# Patient Record
Sex: Male | Born: 1955 | Race: White | Hispanic: No | Marital: Married | Smoking: Former smoker
Health system: Southern US, Community
[De-identification: ages and names within clinical notes are randomized; demographics above are authoritative.]

## PROBLEM LIST (undated history)

## (undated) DIAGNOSIS — I10 Essential (primary) hypertension: Secondary | ICD-10-CM

## (undated) DIAGNOSIS — N319 Neuromuscular dysfunction of bladder, unspecified: Secondary | ICD-10-CM

## (undated) DIAGNOSIS — G459 Transient cerebral ischemic attack, unspecified: Secondary | ICD-10-CM

## (undated) DIAGNOSIS — N189 Chronic kidney disease, unspecified: Secondary | ICD-10-CM

## (undated) DIAGNOSIS — E785 Hyperlipidemia, unspecified: Secondary | ICD-10-CM

## (undated) DIAGNOSIS — R42 Dizziness and giddiness: Secondary | ICD-10-CM

## (undated) DIAGNOSIS — J449 Chronic obstructive pulmonary disease, unspecified: Secondary | ICD-10-CM

## (undated) DIAGNOSIS — K449 Diaphragmatic hernia without obstruction or gangrene: Secondary | ICD-10-CM

## (undated) DIAGNOSIS — E119 Type 2 diabetes mellitus without complications: Secondary | ICD-10-CM

## (undated) HISTORY — DX: Essential (primary) hypertension: I10

## (undated) HISTORY — PX: PENILE PROSTHESIS IMPLANT: SHX240

## (undated) HISTORY — DX: Chronic kidney disease, unspecified: N18.9

## (undated) HISTORY — DX: Type 2 diabetes mellitus without complications: E11.9

## (undated) HISTORY — DX: Chronic obstructive pulmonary disease, unspecified: J44.9

## (undated) HISTORY — DX: Transient cerebral ischemic attack, unspecified: G45.9

## (undated) HISTORY — DX: Diaphragmatic hernia without obstruction or gangrene: K44.9

## (undated) HISTORY — PX: AMPUTATION TOE: SHX6595

## (undated) HISTORY — DX: Hyperlipidemia, unspecified: E78.5

## (undated) HISTORY — DX: Neuromuscular dysfunction of bladder, unspecified: N31.9

## (undated) HISTORY — DX: Dizziness and giddiness: R42

---

## 2013-11-19 DIAGNOSIS — I2510 Atherosclerotic heart disease of native coronary artery without angina pectoris: Secondary | ICD-10-CM | POA: Insufficient documentation

## 2014-05-03 DIAGNOSIS — J449 Chronic obstructive pulmonary disease, unspecified: Secondary | ICD-10-CM | POA: Insufficient documentation

## 2015-05-04 HISTORY — PX: CATARACT EXTRACTION: SUR2

## 2018-12-20 DIAGNOSIS — G4730 Sleep apnea, unspecified: Secondary | ICD-10-CM | POA: Insufficient documentation

## 2019-11-21 ENCOUNTER — Encounter: Payer: Self-pay | Admitting: Sports Medicine

## 2019-11-21 ENCOUNTER — Ambulatory Visit: Payer: Self-pay

## 2019-11-21 ENCOUNTER — Other Ambulatory Visit: Payer: Self-pay

## 2019-11-21 ENCOUNTER — Ambulatory Visit (INDEPENDENT_AMBULATORY_CARE_PROVIDER_SITE_OTHER): Payer: Commercial Managed Care - PPO | Admitting: Sports Medicine

## 2019-11-21 DIAGNOSIS — I739 Peripheral vascular disease, unspecified: Secondary | ICD-10-CM | POA: Diagnosis not present

## 2019-11-21 DIAGNOSIS — E1142 Type 2 diabetes mellitus with diabetic polyneuropathy: Secondary | ICD-10-CM

## 2019-11-21 DIAGNOSIS — M86171 Other acute osteomyelitis, right ankle and foot: Secondary | ICD-10-CM | POA: Diagnosis not present

## 2019-11-21 DIAGNOSIS — L97511 Non-pressure chronic ulcer of other part of right foot limited to breakdown of skin: Secondary | ICD-10-CM | POA: Diagnosis not present

## 2019-11-21 DIAGNOSIS — M79671 Pain in right foot: Secondary | ICD-10-CM

## 2019-11-21 DIAGNOSIS — M216X2 Other acquired deformities of left foot: Secondary | ICD-10-CM | POA: Diagnosis not present

## 2019-11-21 NOTE — Progress Notes (Signed)
Subjective: Nowell Sites is a 64 y.o. male patient seen in office for evaluation of ulceration of the right foot.  Patient reports that this ulcer has been a problem for the last year and a half with it started with the bone popping through the skin and then the opening think and never healed with significant drainage swelling redness wife has been consistent with applying gentamicin ointment and a silver patch to the area and also reports that he is currently on PICC line antibiotics of cefepime and doxycycline which is being managed by infectious disease, reports that he is supposed to go for a MRI on tomorrow and was referred here for further evaluation of the wound for possible consideration for skin graft.  Patient denies current symptoms of nausea vomiting fever or chills  Fasting blood sugar not recorded A1c not noted  Currently on Plavix and Januvia and Lantus  Review of symptoms noncontributory  There are no problems to display for this patient.  Current Outpatient Medications on File Prior to Visit  Medication Sig Dispense Refill   cefdinir (OMNICEF) 300 MG capsule Take 300 mg by mouth 2 (two) times daily.     ciprofloxacin (CIPRO) 500 MG tablet      clopidogrel (PLAVIX) 75 MG tablet Take 75 mg by mouth daily.     doxycycline (VIBRAMYCIN) 100 MG capsule Take 100 mg by mouth 2 (two) times daily.     gentamicin ointment (GARAMYCIN) 0.1 % SMARTSIG:1 Topical Every Night     LANTUS SOLOSTAR 100 UNIT/ML Solostar Pen Inject into the skin.     montelukast (SINGULAIR) 10 MG tablet Take 10 mg by mouth daily.     No current facility-administered medications on file prior to visit.   Not on File  No results found for this or any previous visit (from the past 2160 hour(s)).  Objective: There were no vitals filed for this visit.  General: Patient is awake, alert, oriented x 3 and in no acute distress.  Dermatology: Skin is warm and dry bilateral with a full thickness ulceration  present  Right fifth metatarsal head. Ulceration measures 2 cm x 2.5 cm x 0.3 cm. There is a  Minimal keratotic border with a fibrogranular base. The ulceration does not  probe to bone but is close to capsule. There is no malodor, no active drainage, blanchable erythema, focal edema. No acute signs of infection.   Vascular: Dorsalis Pedis pulse = 1/4 Bilateral,  Posterior Tibial pulse = 1/4 Bilateral,  Capillary Fill Time < 5 seconds  Neurologic: Protective sensation absent bilateral foot there is increased hypersensitivity to ulcerated area.  Musculosketal: There is digital contracture especially on the left with history of previous surgery at this area.  There is mild sensitivity to ulcerated area on the right.  Xrays, right foot: There is bony destruction suggestive of osteomyelitis at the head of the metatarsal with overlying ulcer defect. No gas in soft tissues.  Hammertoe deformity.  No other acute findings.  No results for input(s): GRAMSTAIN, LABORGA in the last 8760 hours.  Assessment and Plan:  Problem List Items Addressed This Visit    None    Visit Diagnoses    Ulcer of right foot limited to breakdown of skin (HCC)    -  Primary   Relevant Orders   DG Foot Complete Right   PVD (peripheral vascular disease) (HCC)       Acute osteomyelitis of right ankle or foot (HCC)       Relevant Medications  cefdinir (OMNICEF) 300 MG capsule   gentamicin ointment (GARAMYCIN) 0.1 %   Prominent metatarsal head of left foot       Diabetic polyneuropathy associated with type 2 diabetes mellitus (HCC)       Relevant Medications   LANTUS SOLOSTAR 100 UNIT/ML Solostar Pen   Right foot pain           -Examined patient and discussed the progression of the wound and treatment alternatives. -Xrays reviewed - Excisionally dedbrided ulceration at right lateral foot at the fifth metatarsophalangeal joint to healthy bleeding borders removing nonviable tissue using a sterile chisel blade. Wound  measures post debridement as above. Wound was debrided to the level of the dermis with viable wound base exposed to promote healing. Hemostasis was achieved with manuel pressure. Patient tolerated procedure well without any discomfort or anesthesia necessary for this wound debridement.  -Applied antibiotic cream, offloading pad, and dry sterile dressing and instructed patient to continue with daily dressings at home consisting of the same using offloading padding and gentamicin ointment as well as silver patch that he has at home already -Wound culture reviewed from notes sent by his referring provider history of Pseudomonas continue with antibiotics as recommended by infectious disease of cefepime and doxycycline -Patient to go for MRI on tomorrow for further evaluation and advised patient once the wrist results are available we will give him a phone call for further discussion of treatment options likely patient may need fifth metatarsal head resection with wound debridement and grafting in order to ensure healing however we will make this determination on what patient surgically needs after he gets his MRI performed - Advised patient to go to the ER or return to office if the wound worsens or if constitutional symptoms are present. -Patient to return to office in after MRI for follow up care and evaluation or sooner if problems arise.  Asencion Islam, DPM

## 2019-12-04 ENCOUNTER — Ambulatory Visit (INDEPENDENT_AMBULATORY_CARE_PROVIDER_SITE_OTHER): Payer: Commercial Managed Care - PPO | Admitting: Sports Medicine

## 2019-12-04 ENCOUNTER — Encounter: Payer: Self-pay | Admitting: Sports Medicine

## 2019-12-04 ENCOUNTER — Other Ambulatory Visit: Payer: Self-pay

## 2019-12-04 DIAGNOSIS — I739 Peripheral vascular disease, unspecified: Secondary | ICD-10-CM

## 2019-12-04 DIAGNOSIS — M79671 Pain in right foot: Secondary | ICD-10-CM

## 2019-12-04 DIAGNOSIS — L97514 Non-pressure chronic ulcer of other part of right foot with necrosis of bone: Secondary | ICD-10-CM | POA: Diagnosis not present

## 2019-12-04 DIAGNOSIS — M86171 Other acute osteomyelitis, right ankle and foot: Secondary | ICD-10-CM

## 2019-12-04 DIAGNOSIS — E1142 Type 2 diabetes mellitus with diabetic polyneuropathy: Secondary | ICD-10-CM

## 2019-12-04 DIAGNOSIS — M216X2 Other acquired deformities of left foot: Secondary | ICD-10-CM

## 2019-12-04 NOTE — Progress Notes (Signed)
Subjective: Chase Villanueva is a 64 y.o. male patient seen in office for evaluation of ulceration of the right foot.  Patient reports that this ulcer has been drying up and not draining anything.  Have the lab helping with change the dressing daily applying gentamicin cream and silver patch to the area with padding cushioning to the right fifth toe.  Patient reports that he had his MRI done last week is also here to discuss the results.  Patient denies nausea vomiting fever chills or any other acute symptoms at this time.  Currently on PICC line antibiotics of cefepime and doxycycline managed by infectious disease at [facility].  Fasting blood sugar not recorded A1c not noted  Currently on Plavix and Januvia and Lantus  There are no problems to display for this patient.  Current Outpatient Medications on File Prior to Visit  Medication Sig Dispense Refill  . cefdinir (OMNICEF) 300 MG capsule Take 300 mg by mouth 2 (two) times daily.    . ciprofloxacin (CIPRO) 500 MG tablet     . clopidogrel (PLAVIX) 75 MG tablet Take 75 mg by mouth daily.    Marland Kitchen doxycycline (VIBRAMYCIN) 100 MG capsule Take 100 mg by mouth 2 (two) times daily.    Marland Kitchen gentamicin ointment (GARAMYCIN) 0.1 % SMARTSIG:1 Topical Every Night    . LANTUS SOLOSTAR 100 UNIT/ML Solostar Pen Inject into the skin.    . montelukast (SINGULAIR) 10 MG tablet Take 10 mg by mouth daily.     No current facility-administered medications on file prior to visit.   Not on File  No results found for this or any previous visit (from the past 2160 hour(s)).  Objective: There were no vitals filed for this visit.  General: Patient is awake, alert, oriented x 3 and in no acute distress.  Dermatology: Skin is warm and dry bilateral with a full thickness ulceration present  Right fifth metatarsal head. Ulceration measures 2 cm x 2.5 cm x 0.3 cm unchanged from last visit. There is a  Minimal keratotic border with a fibrogranular base,  probes to bone. There is no malodor, no active drainage, blanchable erythema, minimal focal edema. No acute signs of infection.   Vascular: Dorsalis Pedis pulse = 1/4 Bilateral,  Posterior Tibial pulse = 1/4 Bilateral,  Capillary Fill Time < 5 seconds  Neurologic: Protective sensation absent bilateral foot there is increased hypersensitivity to ulcerated area.  Musculosketal: There is digital contracture especially on the left with history of previous surgery at this area.  There is mild sensitivity to ulcerated area on the right and mild varus rotation of the right fifth toe.  MRI supportive of osteomyelitis at the head and surgical neck of the fifth metatarsal right foot  No results for input(s): GRAMSTAIN, LABORGA in the last 8760 hours.  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 - Excisionally  dedbrided ulceration at right lateral foot at the fifth metatarsophalangeal joint to healthy bleeding borders removing nonviable tissue using a sterile chisel blade. Wound measures post debridement as above. Wound was debrided to the level of the dermis with viable wound base exposed to promote healing. Hemostasis was achieved with manuel pressure. Patient tolerated procedure well without any discomfort or anesthesia necessary for this wound debridement.  -Applied antibiotic cream, offloading pad, and dry sterile dressing and instructed patient to continue with daily dressings at home consisting of the same using offloading padding and gentamicin  ointment as well as silver patch that he has at home already like before -Continue with PICC line antibiotics is managed by infectious disease of cefepime and doxycycline; advised patient to have his infectious disease doctor to fax any updates regarding his antibiotic therapy at this time will be planning surgery since an MRI suggest that there is osteomyelitis at the fifth metatarsal -Patient opt for surgical management. Consent obtained for wound debridement, application of graft, and removal of infected bone, fifth metatarsal head, right foot.  Pre and Post op course explained. Risks, benefits, alternatives explained. No guarantees given or implied. Surgical booking slip submitted and provided patient with Surgical packet and info for [facility] -Patient to get H&P from Dr. Len Childs office -Order placed for [facility] for presurgical PT as well as any postoperative care as well -Patient to return to office in 2 weeks for wound care or surgery which ever is sooner.  Asencion Islam, DPM

## 2019-12-10 ENCOUNTER — Telehealth: Payer: Self-pay | Admitting: *Deleted

## 2019-12-10 DIAGNOSIS — Z01818 Encounter for other preprocedural examination: Secondary | ICD-10-CM

## 2019-12-10 NOTE — Telephone Encounter (Signed)
"  I'm calling on behalf of Linard Daft .  I'm calling to follow-up and see what times and dates that the hospital is available.  His number is [phone].

## 2019-12-11 NOTE — Telephone Encounter (Signed)
I am returning your call.  I apologize for the wait in calling you back.  I'm filling in for Shelly while she's out of the office.  Do you have a surgery date that you like?  "I'd like to do it on Monday, the 16th or on this Friday, the 13th.  I was told she does surgeries on Mondays."  I'll see if they have anything available but I can't guarantee it on Mondays because they usually don't have anything available.  "Will you call and let me know as soon as you know something because my wife has to make arrangements?"  I will let you know as soon as I hear from the scheduler at [facility].  I spoke to Ms. Alona Bene at [facility].  She asked me to fax the information and they would contact me with the time that's available.

## 2019-12-12 NOTE — Telephone Encounter (Signed)
DOS 12/17/2019  CPT CODES: 79892 - METATARSAL HEAD RESECTION AND 11941 - WOUND DEBRIDEMENT RIGHT FOOT  UMR: Eligibility Date - 10/01/2017  Co-pay: $0 Co-insurance: 90% / 10% Deductible:  $3000 met Out of pocket:  $5000 / $3931.44 met applied  AUTHORIZATION IS REQUIRED     I called and spoke to Karlene Lineman C to initiate authorization for Mr. Chase Villanueva's surgery.  He informed me that clinicals were needed to process the request.  The pending case number is [id number].  He asked me to fax the notes to [phone].    Clinical notes were faxed as requested.

## 2019-12-12 NOTE — Telephone Encounter (Signed)
I am calling to let you know you're scheduled for surgery on Monday, 12/17/2019.  "I got a call from the hospital and they told me."  You need to have a Covid test done prior to the surgery date.  They normally ask you to do it 3 days prior to your surgery date.  Can you go by the [place] office to pick up your lab requisition and go to [facility] on tomorrow?  "I sure can.  Where's that located?"  Ask them at our office and someone can give you the address.  "I'll take care of it tomorrow."

## 2019-12-13 ENCOUNTER — Other Ambulatory Visit: Payer: Self-pay

## 2019-12-13 DIAGNOSIS — Z20822 Contact with and (suspected) exposure to covid-19: Secondary | ICD-10-CM

## 2019-12-14 LAB — SAR COV2 SEROLOGY (COVID19)AB(IGG),IA: DiaSorin SARS-CoV-2 Ab, IgG: NEGATIVE

## 2019-12-16 ENCOUNTER — Other Ambulatory Visit: Payer: Self-pay | Admitting: Sports Medicine

## 2019-12-16 NOTE — Progress Notes (Signed)
Post op meds entered 

## 2019-12-17 DIAGNOSIS — M21541 Acquired clubfoot, right foot: Secondary | ICD-10-CM | POA: Diagnosis not present

## 2019-12-17 DIAGNOSIS — E11621 Type 2 diabetes mellitus with foot ulcer: Secondary | ICD-10-CM

## 2019-12-17 MED ORDER — HYDROCODONE-ACETAMINOPHEN 5-325 MG PO TABS: 1.0000 | ORAL_TABLET | Freq: Four times a day (QID) | ORAL | 0 refills | Status: AC | PRN

## 2019-12-17 MED ORDER — PROMETHAZINE HCL 12.5 MG PO TABS
12.5000 mg | ORAL_TABLET | Freq: Three times a day (TID) | ORAL | 0 refills | Status: DC | PRN
Start: 2019-12-17 — End: 2020-02-27

## 2019-12-17 MED ORDER — DOCUSATE SODIUM 100 MG PO CAPS
100.0000 mg | ORAL_CAPSULE | Freq: Two times a day (BID) | ORAL | 0 refills | Status: DC
Start: 2019-12-17 — End: 2020-02-27

## 2019-12-18 ENCOUNTER — Ambulatory Visit: Payer: Commercial Managed Care - PPO | Admitting: Sports Medicine

## 2019-12-18 ENCOUNTER — Telehealth: Payer: Self-pay | Admitting: Sports Medicine

## 2019-12-18 NOTE — Telephone Encounter (Signed)
Post op check phone call made to patient.  Patient reports that he was up and down on last night but otherwise is doing okay states that his pain this morning is back to normal only numbness and tingling like he had before surgery related to his neuropathy.  Advised patient to continue with rest, elevation, and taking his medications as prescribed.  Advised patient to call office if there are any other problems or concerns.  Patient thanked me for my call and advised me to be careful out there. -Dr. Marylene Land

## 2019-12-25 ENCOUNTER — Ambulatory Visit (INDEPENDENT_AMBULATORY_CARE_PROVIDER_SITE_OTHER): Payer: Commercial Managed Care - PPO | Admitting: Sports Medicine

## 2019-12-25 ENCOUNTER — Other Ambulatory Visit: Payer: Self-pay | Admitting: Sports Medicine

## 2019-12-25 ENCOUNTER — Ambulatory Visit: Payer: Self-pay

## 2019-12-25 ENCOUNTER — Encounter: Payer: Self-pay | Admitting: Sports Medicine

## 2019-12-25 ENCOUNTER — Other Ambulatory Visit: Payer: Self-pay

## 2019-12-25 DIAGNOSIS — M79671 Pain in right foot: Secondary | ICD-10-CM

## 2019-12-25 DIAGNOSIS — E1142 Type 2 diabetes mellitus with diabetic polyneuropathy: Secondary | ICD-10-CM

## 2019-12-25 DIAGNOSIS — M86171 Other acute osteomyelitis, right ankle and foot: Secondary | ICD-10-CM

## 2019-12-25 DIAGNOSIS — Z9889 Other specified postprocedural states: Secondary | ICD-10-CM

## 2019-12-25 NOTE — Progress Notes (Signed)
Subjective: Chase Villanueva is a 64 y.o. male patient seen today in office for POV #1 (DOS 12-17-19), S/P right 5th met head resection and application of graft sub met 5 on right, patient denies pain at surgical site, denies constiutional symptoms except a little sharp pain to toes 2-3 bilateral. No other issues noted.   There are no problems to display for this patient.   Current Outpatient Medications on File Prior to Visit  Medication Sig Dispense Refill  . cefdinir (OMNICEF) 300 MG capsule Take 300 mg by mouth 2 (two) times daily.    . ciprofloxacin (CIPRO) 500 MG tablet     . clopidogrel (PLAVIX) 75 MG tablet Take 75 mg by mouth daily.    Marland Kitchen docusate sodium (COLACE) 100 MG capsule Take 1 capsule (100 mg total) by mouth 2 (two) times daily. 10 capsule 0  . doxycycline (VIBRAMYCIN) 100 MG capsule Take 100 mg by mouth 2 (two) times daily.    Marland Kitchen gentamicin ointment (GARAMYCIN) 0.1 % SMARTSIG:1 Topical Every Night    . LANTUS SOLOSTAR 100 UNIT/ML Solostar Pen Inject into the skin.    . montelukast (SINGULAIR) 10 MG tablet Take 10 mg by mouth daily.    . promethazine (PHENERGAN) 12.5 MG tablet Take 1 tablet (12.5 mg total) by mouth every 8 (eight) hours as needed for nausea or vomiting. 20 tablet 0   No current facility-administered medications on file prior to visit.    Not on File  Objective: There were no vitals filed for this visit.  General: No acute distress, AAOx3  Right foot: Sutures  and staples intact with no gapping or dehiscence at surgical site, graft plantarly appears to be incorporating, mild swelling to right, no erythema, no warmth, no drainage, no signs of infection noted, Capillary fill time <3 seconds in all remaining digits, gross sensation present via light touch to right foot.  No pain with calf compression.   Post Op Xray, Right foot, s/p 5th met resection, Soft tissue swelling within normal limits for post op status.   Assessment and Plan:  Problem List Items  Addressed This Visit    None    Visit Diagnoses    S/P foot surgery, right    -  Primary   Acute osteomyelitis of right ankle or foot ([place])       Diabetic polyneuropathy associated with type 2 diabetes mellitus ([place])       Right foot pain           -Patient seen and evaluated -Xrays reviewed  -Applied adaptic and dry sterile dressing to surgical site right foot secured with ACE wrap and stockinet  -Advised patient to make sure to keep dressings clean, dry, and intact to right surgical site -Continue with CAM boot/surgical shoe and limit weightbearing and activity  -Continue with PICC line antibiotics to end on 8/27, discussed path and micro findings with ID -Will plan for possible suture removal at next office visit. In the meantime, patient to call office if any issues or problems arise.   Landis Martins, DPM

## 2020-01-01 ENCOUNTER — Ambulatory Visit (INDEPENDENT_AMBULATORY_CARE_PROVIDER_SITE_OTHER): Payer: Commercial Managed Care - PPO | Admitting: Sports Medicine

## 2020-01-01 ENCOUNTER — Other Ambulatory Visit: Payer: Self-pay

## 2020-01-01 DIAGNOSIS — M79671 Pain in right foot: Secondary | ICD-10-CM

## 2020-01-01 DIAGNOSIS — Z9889 Other specified postprocedural states: Secondary | ICD-10-CM

## 2020-01-01 DIAGNOSIS — E1142 Type 2 diabetes mellitus with diabetic polyneuropathy: Secondary | ICD-10-CM

## 2020-01-01 DIAGNOSIS — M86171 Other acute osteomyelitis, right ankle and foot: Secondary | ICD-10-CM

## 2020-01-01 NOTE — Progress Notes (Signed)
Subjective: Chase Villanueva is a 64 y.o. male patient seen today in office for POV # 2 (DOS 12-17-19), S/P right 5th met head resection and application of graft sub met 5 on right, patient denies pain at surgical site, denies constiutional symptoms except a little pain every once in a while but otherwise doing good.  Patient also request for me to trim his toenails. No other issues noted.   There are no problems to display for this patient.   Current Outpatient Medications on File Prior to Visit  Medication Sig Dispense Refill  . cefdinir (OMNICEF) 300 MG capsule Take 300 mg by mouth 2 (two) times daily.    . ciprofloxacin (CIPRO) 500 MG tablet     . clopidogrel (PLAVIX) 75 MG tablet Take 75 mg by mouth daily.    Marland Kitchen docusate sodium (COLACE) 100 MG capsule Take 1 capsule (100 mg total) by mouth 2 (two) times daily. 10 capsule 0  . doxycycline (VIBRAMYCIN) 100 MG capsule Take 100 mg by mouth 2 (two) times daily.    Marland Kitchen gentamicin ointment (GARAMYCIN) 0.1 % SMARTSIG:1 Topical Every Night    . LANTUS SOLOSTAR 100 UNIT/ML Solostar Pen Inject into the skin.    . montelukast (SINGULAIR) 10 MG tablet Take 10 mg by mouth daily.    . promethazine (PHENERGAN) 12.5 MG tablet Take 1 tablet (12.5 mg total) by mouth every 8 (eight) hours as needed for nausea or vomiting. 20 tablet 0   No current facility-administered medications on file prior to visit.    Not on File  Objective: There were no vitals filed for this visit.  General: No acute distress, AAOx3  Right foot: Sutures  and staples intact with no gapping or dehiscence at surgical site, graft plantarly appears to be incorporating with mild dried blood to the area, mild swelling to right, no erythema, no warmth, no drainage, no signs of infection noted, Capillary fill time <3 seconds in all remaining digits, gross sensation present via light touch to right foot.  No pain with calf compression.    Assessment and Plan:  Problem List Items Addressed  This Visit    None    Visit Diagnoses    S/P foot surgery, right    -  Primary   Acute osteomyelitis of right ankle or foot ([place])       Diabetic polyneuropathy associated with type 2 diabetes mellitus ([place])       Right foot pain           -Patient seen and evaluated -Sutures were removed but staples left in place -Applied adaptic and dry sterile dressing to surgical site right foot secured with ACE wrap and stockinet  -Advised patient to make sure to keep dressings clean, dry, and intact to right surgical site -Continue with CAM boot/surgical shoe and limit weightbearing and activity  -PICC line antibiotics completed -At no additional charge mechanically debrided nails using a sterile tissue nipper without incident -Will plan for possible staple removal at next office visit. In the meantime, patient to call office if any issues or problems arise.   Landis Martins, DPM

## 2020-01-08 ENCOUNTER — Encounter: Payer: Self-pay | Admitting: Sports Medicine

## 2020-01-08 ENCOUNTER — Ambulatory Visit (INDEPENDENT_AMBULATORY_CARE_PROVIDER_SITE_OTHER): Payer: Commercial Managed Care - PPO | Admitting: Sports Medicine

## 2020-01-08 ENCOUNTER — Other Ambulatory Visit: Payer: Self-pay

## 2020-01-08 DIAGNOSIS — M86171 Other acute osteomyelitis, right ankle and foot: Secondary | ICD-10-CM

## 2020-01-08 DIAGNOSIS — Z9889 Other specified postprocedural states: Secondary | ICD-10-CM

## 2020-01-08 DIAGNOSIS — E1142 Type 2 diabetes mellitus with diabetic polyneuropathy: Secondary | ICD-10-CM

## 2020-01-08 DIAGNOSIS — M79671 Pain in right foot: Secondary | ICD-10-CM

## 2020-01-08 NOTE — Progress Notes (Signed)
Subjective: Parker Wherley is a 64 y.o. male patient seen today in office for POV #3 (DOS 12-17-19), S/P right 5th met head resection and application of graft sub met 5 on right, patient denies pain at surgical site, and reports that he was able to keep dressing clean dry and intact and is ready to go fishing. Denies nausea vomiting fever chills or any other constitutional symptoms at this time. No other issues noted.   Fasting blood sugar 120 A1c 6.5  There are no problems to display for this patient.   Current Outpatient Medications on File Prior to Visit  Medication Sig Dispense Refill  . cefdinir (OMNICEF) 300 MG capsule Take 300 mg by mouth 2 (two) times daily.    . ciprofloxacin (CIPRO) 500 MG tablet     . clopidogrel (PLAVIX) 75 MG tablet Take 75 mg by mouth daily.    Marland Kitchen docusate sodium (COLACE) 100 MG capsule Take 1 capsule (100 mg total) by mouth 2 (two) times daily. 10 capsule 0  . doxycycline (VIBRAMYCIN) 100 MG capsule Take 100 mg by mouth 2 (two) times daily.    Marland Kitchen gentamicin ointment (GARAMYCIN) 0.1 % SMARTSIG:1 Topical Every Night    . LANTUS SOLOSTAR 100 UNIT/ML Solostar Pen Inject into the skin.    . montelukast (SINGULAIR) 10 MG tablet Take 10 mg by mouth daily.    . promethazine (PHENERGAN) 12.5 MG tablet Take 1 tablet (12.5 mg total) by mouth every 8 (eight) hours as needed for nausea or vomiting. 20 tablet 0   No current facility-administered medications on file prior to visit.    Not on File  Objective: There were no vitals filed for this visit.  General: No acute distress, AAOx3  Right foot: Sutures  and staples intact with no gapping or dehiscence at surgical site, graft plantarly appears to be incorporating with mild dried blood to the area once this dried blood was debrided there was of note ulcer bed with remnants of graft that measure less than 1 cm at the lateral fifth metatarsal head, mild swelling to right, no erythema, no warmth, no drainage, no signs of  infection noted, Capillary fill time <3 seconds in all remaining digits, gross sensation present via light touch to right foot.  No pain with calf compression.    Assessment and Plan:  Problem List Items Addressed This Visit    None    Visit Diagnoses    S/P foot surgery, right    -  Primary   Acute osteomyelitis of right ankle or foot ([place])       Diabetic polyneuropathy associated with type 2 diabetes mellitus ([place])       Right foot pain           -Patient seen and evaluated -Staples were removed and graft/wound ulcer site was debrided using a 15 blade to healthy bleeding margins we applied Adaptic and dry dressing advised patient to keep clean dry and intact until next office visit -Continue with CAM boot/surgical shoe and limit weightbearing and activity  -Will plan for wound check at next office visit. In the meantime, patient to call office if any issues or problems arise.   Landis Martins, DPM

## 2020-01-15 ENCOUNTER — Encounter: Payer: Commercial Managed Care - PPO | Admitting: Sports Medicine

## 2020-01-22 ENCOUNTER — Ambulatory Visit (INDEPENDENT_AMBULATORY_CARE_PROVIDER_SITE_OTHER): Payer: Commercial Managed Care - PPO | Admitting: Sports Medicine

## 2020-01-22 ENCOUNTER — Other Ambulatory Visit: Payer: Self-pay

## 2020-01-22 ENCOUNTER — Encounter: Payer: Self-pay | Admitting: Sports Medicine

## 2020-01-22 DIAGNOSIS — M79671 Pain in right foot: Secondary | ICD-10-CM

## 2020-01-22 DIAGNOSIS — E1142 Type 2 diabetes mellitus with diabetic polyneuropathy: Secondary | ICD-10-CM

## 2020-01-22 DIAGNOSIS — M86171 Other acute osteomyelitis, right ankle and foot: Secondary | ICD-10-CM

## 2020-01-22 DIAGNOSIS — Z9889 Other specified postprocedural states: Secondary | ICD-10-CM

## 2020-01-22 NOTE — Progress Notes (Signed)
Subjective: Chase Villanueva is a 63 y.o. male patient seen today in office for POV # 4 (DOS 12-17-19), S/P right 5th met head resection and application of graft sub met 5 on right, patient denies pain at surgical site, and reports that he was able to keep dressing clean dry with wife helping to change and applied a silver patch to the area.  Patient reports that he did have a slip and fall in his surgical shoe this week but otherwise denies any other pedal complaints at this time.  Fasting blood sugar around 120 A1c 6.5 like before.  There are no problems to display for this patient.   Current Outpatient Medications on File Prior to Visit  Medication Sig Dispense Refill  . cefdinir (OMNICEF) 300 MG capsule Take 300 mg by mouth 2 (two) times daily.    . ciprofloxacin (CIPRO) 500 MG tablet     . clopidogrel (PLAVIX) 75 MG tablet Take 75 mg by mouth daily.    Marland Kitchen docusate sodium (COLACE) 100 MG capsule Take 1 capsule (100 mg total) by mouth 2 (two) times daily. 10 capsule 0  . doxycycline (VIBRAMYCIN) 100 MG capsule Take 100 mg by mouth 2 (two) times daily.    Marland Kitchen gentamicin ointment (GARAMYCIN) 0.1 % SMARTSIG:1 Topical Every Night    . LANTUS SOLOSTAR 100 UNIT/ML Solostar Pen Inject into the skin.    . montelukast (SINGULAIR) 10 MG tablet Take 10 mg by mouth daily.    . promethazine (PHENERGAN) 12.5 MG tablet Take 1 tablet (12.5 mg total) by mouth every 8 (eight) hours as needed for nausea or vomiting. 20 tablet 0   No current facility-administered medications on file prior to visit.    Not on File  Objective: There were no vitals filed for this visit.  General: No acute distress, AAOx3  Right foot: To the right lateral fifth MPJ there is a partial-thickness ulceration that measures 0.3 x 0.4 cm with granular base, mild swelling to right, no erythema, no warmth, no drainage, no signs of infection noted, Capillary fill time <3 seconds in all remaining digits, gross sensation present via light  touch to right foot.  No pain with calf compression.    Assessment and Plan:  Problem List Items Addressed This Visit    None    Visit Diagnoses    S/P foot surgery, right    -  Primary   Acute osteomyelitis of right ankle or foot ([place])       Diabetic polyneuropathy associated with type 2 diabetes mellitus ([place])       Right foot pain           -Patient seen and evaluated -Using a sterile tissue nipper debrided wound at the lateral fifth MPJ to healthy bleeding margins we applied promagran/Prisma dressing covered with dry dressing and advised patient to have wife assist with dressing changes 2 times per week -Continue with CAM boot/surgical shoe and limit weightbearing and activity; replacement surgical shoe provided at this visit to ensure that patient will not have issues with slips or falls -Will plan for wound check at next office visit. In the meantime, patient to call office if any issues or problems arise.   Landis Martins, DPM

## 2020-01-30 ENCOUNTER — Encounter: Payer: Self-pay | Admitting: Sports Medicine

## 2020-01-30 ENCOUNTER — Other Ambulatory Visit: Payer: Self-pay

## 2020-01-30 ENCOUNTER — Ambulatory Visit (INDEPENDENT_AMBULATORY_CARE_PROVIDER_SITE_OTHER): Payer: Commercial Managed Care - PPO | Admitting: Sports Medicine

## 2020-01-30 DIAGNOSIS — M79671 Pain in right foot: Secondary | ICD-10-CM

## 2020-01-30 DIAGNOSIS — E1142 Type 2 diabetes mellitus with diabetic polyneuropathy: Secondary | ICD-10-CM

## 2020-01-30 DIAGNOSIS — Z9889 Other specified postprocedural states: Secondary | ICD-10-CM

## 2020-01-30 DIAGNOSIS — M86171 Other acute osteomyelitis, right ankle and foot: Secondary | ICD-10-CM

## 2020-01-30 NOTE — Progress Notes (Signed)
Subjective: Chase Villanueva is a 64 y.o. male patient seen today in office for POV # 5 (DOS 12-17-19), S/P right 5th met head resection and application of graft sub met 5 on right, patient denies pain at surgical site, and reports that he is doing good his wife has been helping with changing the dressing using Prisma as instructed without any issues and he thinks that it is healed.   Fasting blood sugar around 120 like before without any issues A1c 6.5 like before.  There are no problems to display for this patient.   Current Outpatient Medications on File Prior to Visit  Medication Sig Dispense Refill  . cefdinir (OMNICEF) 300 MG capsule Take 300 mg by mouth 2 (two) times daily.    . ciprofloxacin (CIPRO) 500 MG tablet     . clopidogrel (PLAVIX) 75 MG tablet Take 75 mg by mouth daily.    Marland Kitchen docusate sodium (COLACE) 100 MG capsule Take 1 capsule (100 mg total) by mouth 2 (two) times daily. 10 capsule 0  . doxycycline (VIBRAMYCIN) 100 MG capsule Take 100 mg by mouth 2 (two) times daily.    Marland Kitchen gentamicin ointment (GARAMYCIN) 0.1 % SMARTSIG:1 Topical Every Night    . LANTUS SOLOSTAR 100 UNIT/ML Solostar Pen Inject into the skin.    . montelukast (SINGULAIR) 10 MG tablet Take 10 mg by mouth daily.    . promethazine (PHENERGAN) 12.5 MG tablet Take 1 tablet (12.5 mg total) by mouth every 8 (eight) hours as needed for nausea or vomiting. 20 tablet 0   No current facility-administered medications on file prior to visit.    Not on File  Objective: There were no vitals filed for this visit.  General: No acute distress, AAOx3  Right foot: To the right lateral fifth MPJ there is a prematurely healed ulceration at the lateral aspect of the fifth metatarsal phalangeal joint, mild swelling to right, no erythema, no warmth, no drainage, no signs of infection noted, Capillary fill time <3 seconds in all remaining digits, gross sensation present via light touch to right foot.  No pain with calf compression.     Assessment and Plan:  Problem List Items Addressed This Visit    None    Visit Diagnoses    S/P foot surgery, right    -  Primary   Acute osteomyelitis of right ankle or foot ([place])       Diabetic polyneuropathy associated with type 2 diabetes mellitus ([place])       Right foot pain           -Patient seen and evaluated -Right surgical site appears to be prematurely healed advised patient and wife to apply dry dressing daily on next Monday or Tuesday may attempt to shower and then redressed with dry dressing if area remains scabbed over and healed on next visit may discuss transitioning to a normal shoe and going without a dressing however at this time patient to continue to protect the area -Continue with surgical shoe and limited activity until next office visit -Advised patient to bring shoe and current orthotic with him to next visit for me to evaluate to see if he can start to wear a normal shoe -Will plan for wound check at next office visit. In the meantime, patient to call office if any issues or problems arise.   Landis Martins, DPM

## 2020-02-06 ENCOUNTER — Other Ambulatory Visit: Payer: Self-pay

## 2020-02-06 ENCOUNTER — Encounter: Payer: Self-pay | Admitting: Sports Medicine

## 2020-02-06 ENCOUNTER — Ambulatory Visit (INDEPENDENT_AMBULATORY_CARE_PROVIDER_SITE_OTHER): Payer: Commercial Managed Care - PPO | Admitting: Sports Medicine

## 2020-02-06 DIAGNOSIS — M86171 Other acute osteomyelitis, right ankle and foot: Secondary | ICD-10-CM

## 2020-02-06 DIAGNOSIS — E1142 Type 2 diabetes mellitus with diabetic polyneuropathy: Secondary | ICD-10-CM

## 2020-02-06 DIAGNOSIS — Z9889 Other specified postprocedural states: Secondary | ICD-10-CM

## 2020-02-06 DIAGNOSIS — M79671 Pain in right foot: Secondary | ICD-10-CM

## 2020-02-06 NOTE — Progress Notes (Signed)
Subjective: Chase Villanueva is a 64 y.o. male patient seen today in office for POV # 6 (DOS 12-17-19), S/P right 5th met head resection and application of graft sub met 5 on right, patient denies pain at surgical site, and reports that he had a little drainage from the Drysol 2 days ago but otherwise is doing well.  Fasting blood not recorded but A1c 6.5 like before.  There are no problems to display for this patient.   Current Outpatient Medications on File Prior to Visit  Medication Sig Dispense Refill  . cefdinir (OMNICEF) 300 MG capsule Take 300 mg by mouth 2 (two) times daily.    . ciprofloxacin (CIPRO) 500 MG tablet     . clopidogrel (PLAVIX) 75 MG tablet Take 75 mg by mouth daily.    Marland Kitchen docusate sodium (COLACE) 100 MG capsule Take 1 capsule (100 mg total) by mouth 2 (two) times daily. 10 capsule 0  . doxycycline (VIBRAMYCIN) 100 MG capsule Take 100 mg by mouth 2 (two) times daily.    Marland Kitchen gentamicin ointment (GARAMYCIN) 0.1 % SMARTSIG:1 Topical Every Night    . LANTUS SOLOSTAR 100 UNIT/ML Solostar Pen Inject into the skin.    . montelukast (SINGULAIR) 10 MG tablet Take 10 mg by mouth daily.    . promethazine (PHENERGAN) 12.5 MG tablet Take 1 tablet (12.5 mg total) by mouth every 8 (eight) hours as needed for nausea or vomiting. 20 tablet 0   No current facility-administered medications on file prior to visit.    Not on File  Objective: There were no vitals filed for this visit.  General: No acute distress, AAOx3  Right foot: To the right lateral fifth MPJ there is a prematurely healed ulceration at the lateral aspect of the fifth metatarsal phalangeal joint with dry crusted skin /scabbing, mild swelling to right, no erythema, no warmth, no drainage, no signs of infection noted, Capillary fill time <3 seconds in all remaining digits, gross sensation present via light touch to right foot.  No pain with calf compression.    Assessment and Plan:  Problem List Items Addressed This Visit     None    Visit Diagnoses    S/P foot surgery, right    -  Primary   Acute osteomyelitis of right ankle or foot ([place])       Diabetic polyneuropathy associated with type 2 diabetes mellitus ([place])       Right foot pain           -Patient seen and evaluated -Right surgical site appears to be prematurely healed advised patient and wife to apply protective gauze and Band-Aid dressing -May transition to normal shoe -May do boating/fishing -Will plan for final wound check in 3 weeks at next office visit. In the meantime, patient to call office if any issues or problems arise.   Landis Martins, DPM

## 2020-02-27 ENCOUNTER — Encounter: Payer: Self-pay | Admitting: Sports Medicine

## 2020-02-27 ENCOUNTER — Other Ambulatory Visit: Payer: Self-pay

## 2020-02-27 ENCOUNTER — Ambulatory Visit (INDEPENDENT_AMBULATORY_CARE_PROVIDER_SITE_OTHER): Payer: Managed Care, Other (non HMO) | Admitting: Sports Medicine

## 2020-02-27 DIAGNOSIS — M86171 Other acute osteomyelitis, right ankle and foot: Secondary | ICD-10-CM

## 2020-02-27 DIAGNOSIS — M79671 Pain in right foot: Secondary | ICD-10-CM

## 2020-02-27 DIAGNOSIS — Z9889 Other specified postprocedural states: Secondary | ICD-10-CM

## 2020-02-27 DIAGNOSIS — E1142 Type 2 diabetes mellitus with diabetic polyneuropathy: Secondary | ICD-10-CM

## 2020-02-27 NOTE — Progress Notes (Signed)
Subjective: Chase Villanueva is a 64 y.o. male patient seen today in office for POV # 7 (DOS 12-17-19), S/P right 5th met head resection and application of graft sub met 5 on right, patient reports that he is doing good has not had any drainage but had to stop using a Band-Aid because it irritated the skin.  Fasting blood not recorded but A1c 6.5 like before.  There are no problems to display for this patient.   Current Outpatient Medications on File Prior to Visit  Medication Sig Dispense Refill   clopidogrel (PLAVIX) 75 MG tablet Take 75 mg by mouth daily.     JARDIANCE 25 MG TABS tablet Take 25 mg by mouth every morning.     LANTUS SOLOSTAR 100 UNIT/ML Solostar Pen Inject into the skin.     montelukast (SINGULAIR) 10 MG tablet Take 10 mg by mouth daily.     SPIRIVA RESPIMAT 2.5 MCG/ACT AERS SMARTSIG:2 Puff(s) Via Inhaler Daily     No current facility-administered medications on file prior to visit.    No Known Allergies  Objective: There were no vitals filed for this visit.  General: No acute distress, AAOx3  Right foot: To the right lateral fifth MPJ there is a prematurely healed ulceration at the lateral aspect of the fifth metatarsal phalangeal joint with dry crusted skin /scabbing like previous, mild swelling to right, no erythema, no warmth, no drainage, no signs of infection noted, Capillary fill time <3 seconds in all remaining digits, gross sensation present via light touch to right foot.  No pain with calf compression.    Assessment and Plan:  Problem List Items Addressed This Visit    None    Visit Diagnoses    S/P foot surgery, right    -  Primary   Acute osteomyelitis of right ankle or foot ([place])       Diabetic polyneuropathy associated with type 2 diabetes mellitus ([place])       Relevant Medications   JARDIANCE 25 MG TABS tablet   Right foot pain           -Patient seen and evaluated -Right surgical site appears to be healing well with dry scab slowly using  a 15 blade removed scab at right foot and advised patient to continue to apply protective gauze dressing since he cannot tolerate a Band-Aid to this area -Continue with normal shoes -Next visit patient will bring in current shoes and also will bring in his water boots for me to evaluate with Liliane Channel to see if we can make him something that will offload his problem areas to prevent him from having any future problems especially submet 1 and 5.  Landis Martins, DPM

## 2020-03-12 ENCOUNTER — Other Ambulatory Visit: Payer: Managed Care, Other (non HMO) | Admitting: Orthotics

## 2020-03-12 ENCOUNTER — Ambulatory Visit (INDEPENDENT_AMBULATORY_CARE_PROVIDER_SITE_OTHER): Payer: Managed Care, Other (non HMO) | Admitting: Sports Medicine

## 2020-03-12 ENCOUNTER — Encounter: Payer: Self-pay | Admitting: Sports Medicine

## 2020-03-12 ENCOUNTER — Other Ambulatory Visit: Payer: Self-pay

## 2020-03-12 DIAGNOSIS — M86171 Other acute osteomyelitis, right ankle and foot: Secondary | ICD-10-CM

## 2020-03-12 DIAGNOSIS — E1142 Type 2 diabetes mellitus with diabetic polyneuropathy: Secondary | ICD-10-CM

## 2020-03-12 DIAGNOSIS — Z9889 Other specified postprocedural states: Secondary | ICD-10-CM

## 2020-03-12 DIAGNOSIS — M79671 Pain in right foot: Secondary | ICD-10-CM

## 2020-03-12 NOTE — Progress Notes (Signed)
Subjective: Chase Villanueva is a 64 y.o. male patient seen today in office for POV # 8 (DOS 12-17-19), S/P right 5th met head resection and application of graft sub met 5 on right, patient reports that he is doing good no drainage on dressing. No other issues noted.   Fasting blood not recorded but A1c 6.5 like before.  There are no problems to display for this patient.   Current Outpatient Medications on File Prior to Visit  Medication Sig Dispense Refill  . clopidogrel (PLAVIX) 75 MG tablet Take 75 mg by mouth daily.    Marland Kitchen JARDIANCE 25 MG TABS tablet Take 25 mg by mouth every morning.    Marland Kitchen LANTUS SOLOSTAR 100 UNIT/ML Solostar Pen Inject into the skin.    . montelukast (SINGULAIR) 10 MG tablet Take 10 mg by mouth daily.    Marland Kitchen SPIRIVA RESPIMAT 2.5 MCG/ACT AERS SMARTSIG:2 Puff(s) Via Inhaler Daily     No current facility-administered medications on file prior to visit.    No Known Allergies  Objective: There were no vitals filed for this visit.  General: No acute distress, AAOx3  Right foot: To the right lateral fifth MPJ there is a prematurely healed ulceration at the lateral aspect of the fifth metatarsal phalangeal joint with dry crusted skin /scabbing like previous with dry blood, mild swelling to right, no erythema, no warmth, no drainage, no signs of infection noted, Capillary fill time <3 seconds in all remaining digits, gross sensation present via light touch to right foot.  S/p right 4th toe amputation prior. No pain with calf compression.    Assessment and Plan:  Problem List Items Addressed This Visit    None    Visit Diagnoses    S/P foot surgery, right    -  Primary   Acute osteomyelitis of right ankle or foot ([place])       Diabetic polyneuropathy associated with type 2 diabetes mellitus ([place])       Right foot pain           -Patient seen and evaluated -Right surgical site appears to be healing well with dry scab slowly using a 15 blade removed scab at right foot and  advised patient to continue to apply protective gauze dressing since he cannot tolerate a Band-Aid to this area for 1 more week then after may try clean sock and no dressing -Patient was seen by Liliane Channel today and current insoles were modified to offload sub met 1 and 5 -Return for foot and orthotic check as scheduled.   Landis Martins, DPM

## 2020-06-10 ENCOUNTER — Encounter: Payer: Self-pay | Admitting: Sports Medicine

## 2020-06-10 ENCOUNTER — Ambulatory Visit (INDEPENDENT_AMBULATORY_CARE_PROVIDER_SITE_OTHER): Payer: Managed Care, Other (non HMO) | Admitting: Sports Medicine

## 2020-06-10 ENCOUNTER — Other Ambulatory Visit: Payer: Self-pay

## 2020-06-10 DIAGNOSIS — B351 Tinea unguium: Secondary | ICD-10-CM | POA: Diagnosis not present

## 2020-06-10 DIAGNOSIS — E1142 Type 2 diabetes mellitus with diabetic polyneuropathy: Secondary | ICD-10-CM | POA: Diagnosis not present

## 2020-06-10 DIAGNOSIS — M79674 Pain in right toe(s): Secondary | ICD-10-CM

## 2020-06-10 DIAGNOSIS — M79675 Pain in left toe(s): Secondary | ICD-10-CM

## 2020-06-10 DIAGNOSIS — Z89421 Acquired absence of other right toe(s): Secondary | ICD-10-CM

## 2020-06-10 DIAGNOSIS — I739 Peripheral vascular disease, unspecified: Secondary | ICD-10-CM

## 2020-06-10 NOTE — Progress Notes (Signed)
Subjective: Chase Villanueva is a 65 y.o. male patient seen today in office for diabetic nail trim.  Patient reports that he is doing good previous surgical site is well-healed.  Patient denies any increase in redness warmth or drainage from previous surgical site but does admit to noticing some increase in swelling in both feet and legs.  Fasting blood not recorded but A1c 6.5 like before.  Last PCP visit 3 months and will see PCP again on next week.  There are no problems to display for this patient.   Current Outpatient Medications on File Prior to Visit  Medication Sig Dispense Refill  . clopidogrel (PLAVIX) 75 MG tablet Take 75 mg by mouth daily.    Marland Kitchen JARDIANCE 25 MG TABS tablet Take 25 mg by mouth every morning.    Marland Kitchen LANTUS SOLOSTAR 100 UNIT/ML Solostar Pen Inject into the skin.    . montelukast (SINGULAIR) 10 MG tablet Take 10 mg by mouth daily.    Marland Kitchen SPIRIVA RESPIMAT 2.5 MCG/ACT AERS SMARTSIG:2 Puff(s) Via Inhaler Daily     No current facility-administered medications on file prior to visit.    No Known Allergies  Objective: There were no vitals filed for this visit.  General: No acute distress, AAOx3  Derm: Right foot previous surgical site well-healed.  Nails x9 are thickened and elongated consistent with onychomycosis.  There are no openings or signs of infection noted bilateral.  Neurovascular status: Unchanged from prior  Ortho: Status post previous right fourth toe amputation.  Assessment and Plan:  Problem List Items Addressed This Visit   None   Visit Diagnoses    Pain due to onychomycosis of toenails of both feet    -  Primary   Diabetic polyneuropathy associated with type 2 diabetes mellitus ([facility])       PVD (peripheral vascular disease) ([facility])       Status post amputation of lesser toe of right foot ([facility])           -Patient seen and evaluated -Right foot surgical site well-healed -Mechanically debrided nails x9 using a sterile nail nipper without  incident -Advised elevation and for patient to return to using his compression garments to assist with edema control -Advised patient for swelling and cramping to follow-up with PCP for additional recommendations and may also try tonic water -Continue with good supportive shoes with offloading insoles -Return for routine diabetic foot care as scheduled in 10 weeks or sooner if problems or issues arise.  Asencion Islam, DPM

## 2020-08-22 ENCOUNTER — Encounter: Payer: Self-pay | Admitting: Sports Medicine

## 2020-08-22 ENCOUNTER — Ambulatory Visit (INDEPENDENT_AMBULATORY_CARE_PROVIDER_SITE_OTHER): Payer: Managed Care, Other (non HMO) | Admitting: Sports Medicine

## 2020-08-22 ENCOUNTER — Other Ambulatory Visit: Payer: Self-pay

## 2020-08-22 DIAGNOSIS — E1142 Type 2 diabetes mellitus with diabetic polyneuropathy: Secondary | ICD-10-CM | POA: Diagnosis not present

## 2020-08-22 DIAGNOSIS — M79675 Pain in left toe(s): Secondary | ICD-10-CM

## 2020-08-22 DIAGNOSIS — B351 Tinea unguium: Secondary | ICD-10-CM | POA: Diagnosis not present

## 2020-08-22 DIAGNOSIS — I739 Peripheral vascular disease, unspecified: Secondary | ICD-10-CM

## 2020-08-22 DIAGNOSIS — Z89421 Acquired absence of other right toe(s): Secondary | ICD-10-CM

## 2020-08-22 DIAGNOSIS — M79674 Pain in right toe(s): Secondary | ICD-10-CM

## 2020-08-22 DIAGNOSIS — Z9889 Other specified postprocedural states: Secondary | ICD-10-CM

## 2020-08-22 NOTE — Progress Notes (Signed)
Subjective: Chase Villanueva is a 65 y.o. male patient seen today in office for diabetic nail trim.    Fasting blood 130 and A1c 8.9 last PCP visit Dr. Belva Crome yesterday.   There are no problems to display for this patient.   Current Outpatient Medications on File Prior to Visit  Medication Sig Dispense Refill  . clopidogrel (PLAVIX) 75 MG tablet Take 75 mg by mouth daily.    Marland Kitchen JARDIANCE 25 MG TABS tablet Take 25 mg by mouth every morning.    Marland Kitchen LANTUS SOLOSTAR 100 UNIT/ML Solostar Pen Inject into the skin.    . montelukast (SINGULAIR) 10 MG tablet Take 10 mg by mouth daily.    Marland Kitchen SPIRIVA RESPIMAT 2.5 MCG/ACT AERS SMARTSIG:2 Puff(s) Via Inhaler Daily     No current facility-administered medications on file prior to visit.    No Known Allergies  Objective: There were no vitals filed for this visit.  General: No acute distress, AAOx3  Derm: Right foot previous surgical site well-healed.  Nails x9 are thickened and elongated consistent with onychomycosis.  There are no openings or signs of infection noted bilateral.  Neurovascular status: Unchanged from prior  Ortho: Status post previous right fourth toe amputation.  Assessment and Plan:  Problem List Items Addressed This Visit   None   Visit Diagnoses    Pain due to onychomycosis of toenails of both feet    -  Primary   Diabetic polyneuropathy associated with type 2 diabetes mellitus ([facility])       PVD (peripheral vascular disease) ([facility])       Status post amputation of lesser toe of right foot ([facility])       S/P foot surgery, right           -Patient seen and evaluated -Encouraged glycemic control -Mechanically debrided nails x9 using a sterile nail nipper without incident -Continue with good supportive shoes with offloading insoles  -Return for routine diabetic foot care as scheduled in 10 weeks or sooner if problems or issues arise.  Asencion Islam, DPM

## 2020-10-31 ENCOUNTER — Ambulatory Visit: Payer: Managed Care, Other (non HMO) | Admitting: Sports Medicine

## 2020-11-21 ENCOUNTER — Encounter: Payer: Self-pay | Admitting: Sports Medicine

## 2020-11-21 ENCOUNTER — Other Ambulatory Visit: Payer: Self-pay

## 2020-11-21 ENCOUNTER — Ambulatory Visit (INDEPENDENT_AMBULATORY_CARE_PROVIDER_SITE_OTHER): Payer: Managed Care, Other (non HMO) | Admitting: Sports Medicine

## 2020-11-21 DIAGNOSIS — Z89421 Acquired absence of other right toe(s): Secondary | ICD-10-CM

## 2020-11-21 DIAGNOSIS — B351 Tinea unguium: Secondary | ICD-10-CM | POA: Diagnosis not present

## 2020-11-21 DIAGNOSIS — E1142 Type 2 diabetes mellitus with diabetic polyneuropathy: Secondary | ICD-10-CM

## 2020-11-21 DIAGNOSIS — I739 Peripheral vascular disease, unspecified: Secondary | ICD-10-CM | POA: Diagnosis not present

## 2020-11-21 DIAGNOSIS — M79675 Pain in left toe(s): Secondary | ICD-10-CM

## 2020-11-21 DIAGNOSIS — M79674 Pain in right toe(s): Secondary | ICD-10-CM

## 2020-11-21 DIAGNOSIS — Z9889 Other specified postprocedural states: Secondary | ICD-10-CM

## 2020-11-21 NOTE — Progress Notes (Signed)
Subjective: Chase Villanueva is a 65 y.o. male patient seen today in office for diabetic nail trim.  No other pedal complaints noted.  Fasting blood sugar not recorded but per patient has been doing really well.  There are no problems to display for this patient.   Current Outpatient Medications on File Prior to Visit  Medication Sig Dispense Refill   clopidogrel (PLAVIX) 75 MG tablet Take 75 mg by mouth daily.     JARDIANCE 25 MG TABS tablet Take 25 mg by mouth every morning.     LANTUS SOLOSTAR 100 UNIT/ML Solostar Pen Inject into the skin.     montelukast (SINGULAIR) 10 MG tablet Take 10 mg by mouth daily.     SPIRIVA RESPIMAT 2.5 MCG/ACT AERS SMARTSIG:2 Puff(s) Via Inhaler Daily     No current facility-administered medications on file prior to visit.    No Known Allergies  Objective: There were no vitals filed for this visit.  General: No acute distress, AAOx3  Derm: Right foot previous surgical site well-healed.  Nails x9 are thickened and elongated consistent with onychomycosis.  There is dried blood underneath the right hallux nail however the hallux nail appears stable and not infected.  There are no openings or signs of infection noted bilateral.  Neurovascular status: Unchanged from prior  Ortho: Status post previous right fourth toe amputation.  Assessment and Plan:  Problem List Items Addressed This Visit   None Visit Diagnoses     Pain due to onychomycosis of toenails of both feet    -  Primary   Diabetic polyneuropathy associated with type 2 diabetes mellitus ([facility])       PVD (peripheral vascular disease) ([facility])       Status post amputation of lesser toe of right foot ([facility])       S/P foot surgery, right            -Patient seen and evaluated -Discussed continued foot care in the setting of diabetes -Mechanically debrided nails x9 using a sterile nail nipper without incident -Continue with good supportive shoes that do not rub toes and offloading insoles   -Return for routine diabetic foot care as scheduled in 10 weeks or sooner if problems or issues arise.  Chase Villanueva, DPM

## 2021-01-30 ENCOUNTER — Other Ambulatory Visit: Payer: Self-pay

## 2021-01-30 ENCOUNTER — Encounter: Payer: Self-pay | Admitting: Sports Medicine

## 2021-01-30 ENCOUNTER — Ambulatory Visit (INDEPENDENT_AMBULATORY_CARE_PROVIDER_SITE_OTHER): Payer: Managed Care, Other (non HMO) | Admitting: Sports Medicine

## 2021-01-30 DIAGNOSIS — M79675 Pain in left toe(s): Secondary | ICD-10-CM

## 2021-01-30 DIAGNOSIS — M79674 Pain in right toe(s): Secondary | ICD-10-CM

## 2021-01-30 DIAGNOSIS — I739 Peripheral vascular disease, unspecified: Secondary | ICD-10-CM

## 2021-01-30 DIAGNOSIS — B351 Tinea unguium: Secondary | ICD-10-CM | POA: Diagnosis not present

## 2021-01-30 DIAGNOSIS — E1142 Type 2 diabetes mellitus with diabetic polyneuropathy: Secondary | ICD-10-CM

## 2021-01-30 NOTE — Progress Notes (Signed)
Subjective: Chase Villanueva is a 65 y.o. male patient seen today in office for diabetic nail trim.  No other pedal complaints noted.  Fasting blood sugar not recorded today but last A1c was around 7.  Reports that he has to see a nephrologist because his kidney function is changing.  Denies any other changes with medical conditions.  There are no problems to display for this patient.   Current Outpatient Medications on File Prior to Visit  Medication Sig Dispense Refill   atorvastatin (LIPITOR) 80 MG tablet Take 80 mg by mouth daily.     clopidogrel (PLAVIX) 75 MG tablet Take 75 mg by mouth daily.     JARDIANCE 25 MG TABS tablet Take 25 mg by mouth every morning.     LANTUS SOLOSTAR 100 UNIT/ML Solostar Pen Inject into the skin.     montelukast (SINGULAIR) 10 MG tablet Take 10 mg by mouth daily.     olmesartan (BENICAR) 40 MG tablet Take 40 mg by mouth daily.     SPIRIVA RESPIMAT 2.5 MCG/ACT AERS SMARTSIG:2 Puff(s) Via Inhaler Daily     XIGDUO XR 01-999 MG TB24 Take 1 tablet by mouth daily.     No current facility-administered medications on file prior to visit.    No Known Allergies  Objective: There were no vitals filed for this visit.  General: No acute distress, AAOx3  Derm: Previous surgical sites well-healed.  Nails x9 are thickened and elongated consistent with onychomycosis.  There is dried blood underneath the right hallux nail however the hallux nail appears stable and not infected and slowly growing out.  There are no openings or signs of infection noted bilateral.  Neurovascular status: Unchanged from prior  Ortho: Status post previous right fourth toe amputation.  Assessment and Plan:  Problem List Items Addressed This Visit   None Visit Diagnoses     Pain due to onychomycosis of toenails of both feet    -  Primary   Diabetic polyneuropathy associated with type 2 diabetes mellitus ([facility])       Relevant Medications   atorvastatin (LIPITOR) 80 MG tablet   XIGDUO  XR 01-999 MG TB24   olmesartan (BENICAR) 40 MG tablet   PVD (peripheral vascular disease) ([facility])       Relevant Medications   atorvastatin (LIPITOR) 80 MG tablet   olmesartan (BENICAR) 40 MG tablet        -Patient seen and evaluated -Discussed continued foot care in the setting of diabetes -Mechanically debrided nails x9 using a sterile nail nipper without incident -Recommend compression garments from [place]  -Return for routine diabetic foot care as scheduled in 10 weeks or sooner if problems or issues arise.  Asencion Islam, DPM

## 2021-04-03 ENCOUNTER — Other Ambulatory Visit: Payer: Self-pay | Admitting: Radiation Oncology

## 2021-04-03 ENCOUNTER — Other Ambulatory Visit (HOSPITAL_COMMUNITY): Payer: Self-pay | Admitting: Radiation Oncology

## 2021-04-03 DIAGNOSIS — R339 Retention of urine, unspecified: Secondary | ICD-10-CM

## 2021-04-10 ENCOUNTER — Ambulatory Visit (INDEPENDENT_AMBULATORY_CARE_PROVIDER_SITE_OTHER): Payer: Managed Care, Other (non HMO) | Admitting: Sports Medicine

## 2021-04-10 ENCOUNTER — Encounter: Payer: Self-pay | Admitting: Sports Medicine

## 2021-04-10 DIAGNOSIS — M79675 Pain in left toe(s): Secondary | ICD-10-CM | POA: Diagnosis not present

## 2021-04-10 DIAGNOSIS — E1142 Type 2 diabetes mellitus with diabetic polyneuropathy: Secondary | ICD-10-CM | POA: Diagnosis not present

## 2021-04-10 DIAGNOSIS — M79674 Pain in right toe(s): Secondary | ICD-10-CM | POA: Diagnosis not present

## 2021-04-10 DIAGNOSIS — I739 Peripheral vascular disease, unspecified: Secondary | ICD-10-CM

## 2021-04-10 DIAGNOSIS — B351 Tinea unguium: Secondary | ICD-10-CM

## 2021-04-10 NOTE — Progress Notes (Signed)
Subjective: Chase Villanueva is a 65 y.o. male patient seen today in office for diabetic nail trim.  Admits to some occasional cramping in the morning where his feet and legs turning in and then he has to take a moment to gently stretch them back out before getting out of bed.  Fasting blood sugar not recorded today   Reports that he was diagnosed with stage III kidney disease.  No other pedal complaints or medical conditions noted.  There are no problems to display for this patient.   Current Outpatient Medications on File Prior to Visit  Medication Sig Dispense Refill   atorvastatin (LIPITOR) 80 MG tablet Take 80 mg by mouth daily.     clopidogrel (PLAVIX) 75 MG tablet Take 75 mg by mouth daily.     JARDIANCE 25 MG TABS tablet Take 25 mg by mouth every morning.     LANTUS SOLOSTAR 100 UNIT/ML Solostar Pen Inject into the skin.     montelukast (SINGULAIR) 10 MG tablet Take 10 mg by mouth daily.     olmesartan (BENICAR) 40 MG tablet Take 40 mg by mouth daily.     SPIRIVA RESPIMAT 2.5 MCG/ACT AERS SMARTSIG:2 Puff(s) Via Inhaler Daily     tamsulosin (FLOMAX) 0.4 MG CAPS capsule Take 0.4 mg by mouth daily.     XIGDUO XR 01-999 MG TB24 Take 1 tablet by mouth daily.     No current facility-administered medications on file prior to visit.    No Known Allergies  Objective: There were no vitals filed for this visit.  General: No acute distress, AAOx3  Derm: Previous surgical sites well-healed.  Nails x9 are thickened and elongated consistent with onychomycosis.  There is dried blood underneath the right hallux nail however the hallux nail appears stable and not infected and slowly growing out.  There are no openings or signs of infection noted bilateral.  Neurovascular status: Unchanged from prior  Ortho: Status post previous right fourth toe amputation.  Assessment and Plan:  Problem List Items Addressed This Visit   None Visit Diagnoses     Pain due to onychomycosis of toenails of  both feet    -  Primary   Diabetic polyneuropathy associated with type 2 diabetes mellitus (HCC)       PVD (peripheral vascular disease) (HCC)            -Patient seen and evaluated -Re-Discussed continued foot care in the setting of diabetes -Mechanically debrided nails x9 using a sterile nail nipper without incident -Recommend pickle juice for cramping and gentle stretching and to monitor his coffee intake in the evening -Return for routine diabetic foot care as scheduled in 10 weeks or sooner if problems or issues arise.  Asencion Islam, DPM

## 2021-04-13 ENCOUNTER — Other Ambulatory Visit: Payer: Self-pay

## 2021-04-13 ENCOUNTER — Ambulatory Visit (HOSPITAL_BASED_OUTPATIENT_CLINIC_OR_DEPARTMENT_OTHER)
Admission: RE | Admit: 2021-04-13 | Discharge: 2021-04-13 | Disposition: A | Discharge status: Home / Self Care | Payer: No Typology Code available for payment source | Source: Ambulatory Visit | Attending: Radiation Oncology | Admitting: Radiation Oncology

## 2021-04-13 ENCOUNTER — Encounter (HOSPITAL_BASED_OUTPATIENT_CLINIC_OR_DEPARTMENT_OTHER): Payer: Self-pay

## 2021-04-13 DIAGNOSIS — R339 Retention of urine, unspecified: Secondary | ICD-10-CM | POA: Insufficient documentation

## 2021-06-16 DIAGNOSIS — N319 Neuromuscular dysfunction of bladder, unspecified: Secondary | ICD-10-CM | POA: Diagnosis not present

## 2021-06-18 DIAGNOSIS — I10 Essential (primary) hypertension: Secondary | ICD-10-CM | POA: Diagnosis not present

## 2021-06-18 DIAGNOSIS — E7800 Pure hypercholesterolemia, unspecified: Secondary | ICD-10-CM | POA: Diagnosis not present

## 2021-06-18 DIAGNOSIS — Z89421 Acquired absence of other right toe(s): Secondary | ICD-10-CM | POA: Diagnosis not present

## 2021-06-18 DIAGNOSIS — E1140 Type 2 diabetes mellitus with diabetic neuropathy, unspecified: Secondary | ICD-10-CM | POA: Diagnosis not present

## 2021-06-23 ENCOUNTER — Encounter: Payer: Self-pay | Admitting: Sports Medicine

## 2021-06-23 ENCOUNTER — Ambulatory Visit (INDEPENDENT_AMBULATORY_CARE_PROVIDER_SITE_OTHER): Payer: BC Managed Care – PPO | Admitting: Sports Medicine

## 2021-06-23 DIAGNOSIS — M79674 Pain in right toe(s): Secondary | ICD-10-CM | POA: Diagnosis not present

## 2021-06-23 DIAGNOSIS — M79675 Pain in left toe(s): Secondary | ICD-10-CM | POA: Diagnosis not present

## 2021-06-23 DIAGNOSIS — B351 Tinea unguium: Secondary | ICD-10-CM

## 2021-06-23 DIAGNOSIS — Z89421 Acquired absence of other right toe(s): Secondary | ICD-10-CM

## 2021-06-23 DIAGNOSIS — I739 Peripheral vascular disease, unspecified: Secondary | ICD-10-CM

## 2021-06-23 DIAGNOSIS — E1142 Type 2 diabetes mellitus with diabetic polyneuropathy: Secondary | ICD-10-CM

## 2021-06-23 NOTE — Progress Notes (Signed)
Subjective: Chase Villanueva is a 66 y.o. male patient seen today in office for diabetic nail trim.  No pedal complaints at this time.  Fasting blood sugar not recorded today but last A1c was 10  Last visit to PCP Alinda Deem, MD was last week  There are no problems to display for this patient.    Current Outpatient Medications on File Prior to Visit  Medication Sig Dispense Refill   atorvastatin (LIPITOR) 80 MG tablet Take 80 mg by mouth daily.     clopidogrel (PLAVIX) 75 MG tablet Take 75 mg by mouth daily.     JARDIANCE 25 MG TABS tablet Take 25 mg by mouth every morning.     LANTUS SOLOSTAR 100 UNIT/ML Solostar Pen Inject into the skin.     montelukast (SINGULAIR) 10 MG tablet Take 10 mg by mouth daily.     olmesartan (BENICAR) 40 MG tablet Take 40 mg by mouth daily.     OZEMPIC, 0.25 OR 0.5 MG/DOSE, 2 MG/1.5ML SOPN Inject into the skin.     SPIRIVA RESPIMAT 2.5 MCG/ACT AERS SMARTSIG:2 Puff(s) Via Inhaler Daily     tamsulosin (FLOMAX) 0.4 MG CAPS capsule Take 0.4 mg by mouth daily.     TRESIBA FLEXTOUCH 200 UNIT/ML FlexTouch Pen Inject into the skin.     XIGDUO XR 01-999 MG TB24 Take 1 tablet by mouth daily.     No current facility-administered medications on file prior to visit.    No Known Allergies  Objective: There were no vitals filed for this visit.  General: No acute distress, AAOx3  Derm: Previous surgical sites well-healed with scar contracture noted bilateral fifth MPJs.  Nails x9 are thickened and elongated consistent with onychomycosis.  No openings.  No signs of infection noted.  Neurovascular status: Unchanged from prior, venous stasis skin changes bilateral and faint pedal pulses bilateral.  Ortho: Status post previous right fourth toe amputation and bilateral fifth metatarsal head resection.  Assessment and Plan:  Problem List Items Addressed This Visit   None Visit Diagnoses     Pain due to onychomycosis of toenails of both feet    -  Primary   PVD  (peripheral vascular disease) (HCC)       Status post amputation of lesser toe of right foot (HCC)       Diabetic polyneuropathy associated with type 2 diabetes mellitus (HCC)       Relevant Medications   TRESIBA FLEXTOUCH 200 UNIT/ML FlexTouch Pen   OZEMPIC, 0.25 OR 0.5 MG/DOSE, 2 MG/1.5ML SOPN        -Patient seen and evaluated -Discussed continued foot care in the setting of diabetes -Mechanically debrided nails x9 in width and girth using a sterile nail nipper without incident -Encourage glycemic control and continue with PCP follow-up for diabetic management -Return for routine diabetic foot care as scheduled in 10 weeks or sooner if problems or issues arise.  Asencion Islam, DPM

## 2021-07-08 DIAGNOSIS — N401 Enlarged prostate with lower urinary tract symptoms: Secondary | ICD-10-CM | POA: Diagnosis not present

## 2021-08-03 DIAGNOSIS — R809 Proteinuria, unspecified: Secondary | ICD-10-CM | POA: Diagnosis not present

## 2021-08-03 DIAGNOSIS — I129 Hypertensive chronic kidney disease with stage 1 through stage 4 chronic kidney disease, or unspecified chronic kidney disease: Secondary | ICD-10-CM | POA: Diagnosis not present

## 2021-08-03 DIAGNOSIS — N1831 Chronic kidney disease, stage 3a: Secondary | ICD-10-CM | POA: Diagnosis not present

## 2021-08-03 DIAGNOSIS — E1122 Type 2 diabetes mellitus with diabetic chronic kidney disease: Secondary | ICD-10-CM | POA: Diagnosis not present

## 2021-08-03 DIAGNOSIS — N401 Enlarged prostate with lower urinary tract symptoms: Secondary | ICD-10-CM | POA: Diagnosis not present

## 2021-08-24 DIAGNOSIS — H35033 Hypertensive retinopathy, bilateral: Secondary | ICD-10-CM | POA: Diagnosis not present

## 2021-09-01 ENCOUNTER — Ambulatory Visit (INDEPENDENT_AMBULATORY_CARE_PROVIDER_SITE_OTHER): Payer: BC Managed Care – PPO | Admitting: Sports Medicine

## 2021-09-01 ENCOUNTER — Encounter: Payer: Self-pay | Admitting: Sports Medicine

## 2021-09-01 DIAGNOSIS — M79674 Pain in right toe(s): Secondary | ICD-10-CM | POA: Diagnosis not present

## 2021-09-01 DIAGNOSIS — Z89421 Acquired absence of other right toe(s): Secondary | ICD-10-CM

## 2021-09-01 DIAGNOSIS — B351 Tinea unguium: Secondary | ICD-10-CM

## 2021-09-01 DIAGNOSIS — I739 Peripheral vascular disease, unspecified: Secondary | ICD-10-CM

## 2021-09-01 DIAGNOSIS — E1142 Type 2 diabetes mellitus with diabetic polyneuropathy: Secondary | ICD-10-CM | POA: Diagnosis not present

## 2021-09-01 DIAGNOSIS — M79675 Pain in left toe(s): Secondary | ICD-10-CM | POA: Diagnosis not present

## 2021-09-01 NOTE — Progress Notes (Signed)
Subjective: ?Chase Villanueva is a 66 y.o. male patient seen today in office for diabetic nail trim.  No pedal complaints at this time. ? ?Fasting blood sugar not recorded today but last A1c was 10 as previously noted ? ?Last visit to PCP Alinda Deem, MD was 4 weeks ago. ? ?There are no problems to display for this patient. ? ? ? ?Current Outpatient Medications on File Prior to Visit  ?Medication Sig Dispense Refill  ? atorvastatin (LIPITOR) 80 MG tablet Take 80 mg by mouth daily.    ? clopidogrel (PLAVIX) 75 MG tablet Take 75 mg by mouth daily.    ? JARDIANCE 25 MG TABS tablet Take 25 mg by mouth every morning.    ? LANTUS SOLOSTAR 100 UNIT/ML Solostar Pen Inject into the skin.    ? montelukast (SINGULAIR) 10 MG tablet Take 10 mg by mouth daily.    ? olmesartan (BENICAR) 40 MG tablet Take 40 mg by mouth daily.    ? OZEMPIC, 0.25 OR 0.5 MG/DOSE, 2 MG/1.5ML SOPN Inject into the skin.    ? SPIRIVA RESPIMAT 2.5 MCG/ACT AERS SMARTSIG:2 Puff(s) Via Inhaler Daily    ? tamsulosin (FLOMAX) 0.4 MG CAPS capsule Take 0.4 mg by mouth daily.    ? TRESIBA FLEXTOUCH 200 UNIT/ML FlexTouch Pen Inject into the skin.    ? XIGDUO XR 01-999 MG TB24 Take 1 tablet by mouth daily.    ? ?No current facility-administered medications on file prior to visit.  ? ? ?No Known Allergies ? ?Objective: ?There were no vitals filed for this visit. ? ?General: No acute distress, AAOx3  ?Derm: Previous surgical sites well-healed with scar contracture noted bilateral fifth MPJs.  Nails x9 are thickened and elongated consistent with onychomycosis.  No openings.  No signs of infection noted. ? ?Neurovascular status: Unchanged from prior, venous stasis skin changes bilateral and faint pedal pulses bilateral. ? ?Ortho: Status post previous right fourth toe amputation and bilateral fifth metatarsal head resection. ? ?Assessment and Plan:  ?Problem List Items Addressed This Visit   ?None ?Visit Diagnoses   ? ? Pain due to onychomycosis of toenails of both  feet    -  Primary  ? PVD (peripheral vascular disease) (HCC)      ? Status post amputation of lesser toe of right foot (HCC)      ? Diabetic polyneuropathy associated with type 2 diabetes mellitus (HCC)      ? ?  ?  ? ?-Patient seen and evaluated ?-Discussed continued foot care in the setting of diabetes ?-Mechanically debrided all painful nails x9 in width and girth using a sterile nail nipper without incident ?-Encouraged glycemic control and continue with PCP follow-up for diabetic management as before ?-Continue with diabetic shoes and insoles ?-Return for routine diabetic foot care as scheduled in 10 weeks or sooner if problems or issues arise. ? ?Asencion Islam, DPM ? ?

## 2021-10-02 DIAGNOSIS — N401 Enlarged prostate with lower urinary tract symptoms: Secondary | ICD-10-CM | POA: Diagnosis not present

## 2021-11-06 ENCOUNTER — Encounter: Payer: Self-pay | Admitting: Podiatrist

## 2021-11-06 ENCOUNTER — Ambulatory Visit (INDEPENDENT_AMBULATORY_CARE_PROVIDER_SITE_OTHER): Payer: Managed Care, Other (non HMO) | Admitting: Podiatrist

## 2021-11-06 DIAGNOSIS — Z89421 Acquired absence of other right toe(s): Secondary | ICD-10-CM | POA: Diagnosis not present

## 2021-11-06 DIAGNOSIS — M79674 Pain in right toe(s): Secondary | ICD-10-CM | POA: Diagnosis not present

## 2021-11-06 DIAGNOSIS — M79675 Pain in left toe(s): Secondary | ICD-10-CM | POA: Diagnosis not present

## 2021-11-06 DIAGNOSIS — B351 Tinea unguium: Secondary | ICD-10-CM

## 2021-11-06 NOTE — Progress Notes (Signed)
     HPI: Patient is 66 y.o. male who presents today for diabetic foot nail care.  He denies any change in past medical history, medications or allergies.  He has a callus submetatarsal 4 of the right foot which is bothersome at times.  There are no problems to display for this patient.   Current Outpatient Medications on File Prior to Visit  Medication Sig Dispense Refill   atorvastatin (LIPITOR) 80 MG tablet Take 80 mg by mouth daily.     clopidogrel (PLAVIX) 75 MG tablet Take 75 mg by mouth daily.     JARDIANCE 25 MG TABS tablet Take 25 mg by mouth every morning.     LANTUS SOLOSTAR 100 UNIT/ML Solostar Pen Inject into the skin.     montelukast (SINGULAIR) 10 MG tablet Take 10 mg by mouth daily.     olmesartan (BENICAR) 40 MG tablet Take 40 mg by mouth daily.     OZEMPIC, 0.25 OR 0.5 MG/DOSE, 2 MG/1.5ML SOPN Inject into the skin.     SPIRIVA RESPIMAT 2.5 MCG/ACT AERS SMARTSIG:2 Puff(s) Via Inhaler Daily     tamsulosin (FLOMAX) 0.4 MG CAPS capsule Take 0.4 mg by mouth daily.     TRESIBA FLEXTOUCH 200 UNIT/ML FlexTouch Pen Inject into the skin.     XIGDUO XR 01-999 MG TB24 Take 1 tablet by mouth daily.     No current facility-administered medications on file prior to visit.    No Known Allergies  Review of Systems No fevers, chills, nausea, muscle aches, no difficulty breathing, no calf pain, no chest pain or shortness of breath.   Physical Exam Objective:   General: No acute distress, AAOx3   Derm: Previous surgical sites well-healed with scar contracture noted bilateral fifth MPJs.  Nails x9 are thickened and elongated consistent with onychomycosis.  No openings.  No signs of infection noted.  Mild hyperkeratotic lesion submetatarsal 4 the right foot is noted.   Neurovascular status: Unchanged from prior, venous stasis skin changes bilateral and faint pedal pulses bilateral.   Ortho: Status post previous right  third and fourth toe amputation and bilateral fifth metatarsal  head resection.  Assessment     ICD-10-CM   1. Pain due to onychomycosis of toenails of both feet  B35.1    M79.675    M79.674     2. Status post amputation of lesser toe of right foot (HCC)  Z89.421        Plan  -Patient seen and evaluated -Discussed continued foot care in the presence of diabetes -Mechanically debrided all painful nails x8 in using a sterile nail nipper without incident -Encouraged glycemic control and continue with PCP follow-up for diabetic management as before -Continue with diabetic shoes and insoles -Return for routine diabetic foot care as scheduled in 12 weeks or sooner if problems or issues arise.

## 2021-11-06 NOTE — Patient Instructions (Signed)

## 2021-11-09 ENCOUNTER — Ambulatory Visit: Payer: BC Managed Care – PPO | Admitting: Podiatry

## 2021-11-16 ENCOUNTER — Other Ambulatory Visit: Payer: Self-pay

## 2021-11-16 DIAGNOSIS — K449 Diaphragmatic hernia without obstruction or gangrene: Secondary | ICD-10-CM | POA: Insufficient documentation

## 2021-11-16 DIAGNOSIS — I10 Essential (primary) hypertension: Secondary | ICD-10-CM | POA: Insufficient documentation

## 2021-11-16 DIAGNOSIS — N189 Chronic kidney disease, unspecified: Secondary | ICD-10-CM | POA: Insufficient documentation

## 2021-11-16 DIAGNOSIS — E119 Type 2 diabetes mellitus without complications: Secondary | ICD-10-CM | POA: Insufficient documentation

## 2021-11-16 DIAGNOSIS — G459 Transient cerebral ischemic attack, unspecified: Secondary | ICD-10-CM | POA: Insufficient documentation

## 2021-11-16 DIAGNOSIS — E785 Hyperlipidemia, unspecified: Secondary | ICD-10-CM | POA: Insufficient documentation

## 2021-11-16 DIAGNOSIS — J449 Chronic obstructive pulmonary disease, unspecified: Secondary | ICD-10-CM | POA: Insufficient documentation

## 2021-11-16 DIAGNOSIS — R42 Dizziness and giddiness: Secondary | ICD-10-CM | POA: Insufficient documentation

## 2021-11-16 DIAGNOSIS — N319 Neuromuscular dysfunction of bladder, unspecified: Secondary | ICD-10-CM | POA: Insufficient documentation

## 2021-12-02 ENCOUNTER — Other Ambulatory Visit: Payer: Self-pay

## 2021-12-07 ENCOUNTER — Other Ambulatory Visit: Payer: Self-pay

## 2021-12-07 ENCOUNTER — Encounter: Payer: Self-pay | Admitting: Cardiology

## 2021-12-07 ENCOUNTER — Ambulatory Visit (INDEPENDENT_AMBULATORY_CARE_PROVIDER_SITE_OTHER): Payer: Managed Care, Other (non HMO) | Admitting: Cardiology

## 2021-12-07 VITALS — BP 140/74 | HR 86 | Ht 70.0 in | Wt 254.0 lb

## 2021-12-07 DIAGNOSIS — R011 Cardiac murmur, unspecified: Secondary | ICD-10-CM | POA: Insufficient documentation

## 2021-12-07 DIAGNOSIS — R0609 Other forms of dyspnea: Secondary | ICD-10-CM | POA: Insufficient documentation

## 2021-12-07 DIAGNOSIS — I10 Essential (primary) hypertension: Secondary | ICD-10-CM | POA: Insufficient documentation

## 2021-12-07 DIAGNOSIS — E782 Mixed hyperlipidemia: Secondary | ICD-10-CM

## 2021-12-07 DIAGNOSIS — E088 Diabetes mellitus due to underlying condition with unspecified complications: Secondary | ICD-10-CM | POA: Diagnosis not present

## 2021-12-07 DIAGNOSIS — E669 Obesity, unspecified: Secondary | ICD-10-CM | POA: Insufficient documentation

## 2021-12-07 NOTE — Patient Instructions (Signed)
Medication Instructions:  Your physician recommends that you continue on your current medications as directed. Please refer to the Current Medication list given to you today.  *If you need a refill on your cardiac medications before your next appointment, please call your pharmacy*   Lab Work: None If you have labs (blood work) drawn today and your tests are completely normal, you will receive your results only by: MyChart Message (if you have MyChart) OR A paper copy in the mail If you have any lab test that is abnormal or we need to change your treatment, we will call you to review the results.   Testing/Procedures: Your physician has requested that you have an echocardiogram. Echocardiography is a painless test that uses sound waves to create images of your heart. It provides your doctor with information about the size and shape of your heart and how well your heart's chambers and valves are working. This procedure takes approximately one hour. There are no restrictions for this procedure.    [facility] [address] [place], [place] [address] Phone:  [phone]    Please arrive 15 minutes prior to your appointment time for registration and insurance purposes.  The test will take approximately 3 to 4 hours to complete; you may bring reading material.  If someone comes with you to your appointment, they will need to remain in the main lobby due to limited space in the testing area. **If you are pregnant or breastfeeding, please notify the nuclear lab prior to your appointment**  How to prepare for your Myocardial Perfusion Test: Do not eat or drink 3 hours prior to your test, except you may have water. Do not consume products containing caffeine (regular or decaffeinated) 12 hours prior to your test. (ex: coffee, chocolate, sodas, tea). Do bring a list of your current medications with you.  If not listed below, you may take your medications as  normal. HOLD diabetic medication/insulin the morning of the test: Levemir Do wear comfortable clothes (no dresses or overalls) and walking shoes, tennis shoes preferred (No heels or open toe shoes are allowed). Do NOT wear cologne, perfume, aftershave, or lotions (deodorant is allowed). If these instructions are not followed, your test will have to be rescheduled.  Please report to [address] for your test.  If you have questions or concerns about your appointment, you can call the [facility] at [phone].  If you cannot keep your appointment, please provide 24 hours notification to the Nuclear Lab, to avoid a possible $50 charge to your account.    Follow-Up: At [facility], you and your health needs are our priority.  As part of our continuing mission to provide you with exceptional heart care, we have created designated Provider Care Teams.  These Care Teams include your primary Cardiologist (physician) and Advanced Practice Providers (APPs -  Physician Assistants and Nurse Practitioners) who all work together to provide you with the care you need, when you need it.  We recommend signing up for the patient portal called "MyChart".  Sign up information is provided on this After Visit Summary.  MyChart is used to connect with patients for Virtual Visits (Telemedicine).  Patients are able to view lab/test results, encounter notes, upcoming appointments, etc.  Non-urgent messages can be sent to your provider as well.   To learn more about what you can do with MyChart, go to [email or web address].    Your next appointment:   6  month(s)  The format for your next appointment:   In Person  Provider:   Belva Crome, MD    Other Instructions None  Important Information About Sugar

## 2021-12-07 NOTE — Progress Notes (Signed)
Cardiology Office Note:    Date:  12/07/2021   ID:  Chase Villanueva, DOB November 13, 1955, MRN [id number]  PCP:  Alinda Deem, MD  Cardiologist:  Garwin Brothers, MD   Referring MD: Karilyn Cota, NP    ASSESSMENT:    1. Mixed hyperlipidemia   2. DOE (dyspnea on exertion)   3. Essential hypertension   4. Diabetes mellitus due to underlying condition with unspecified complications (HCC)   5. Obesity (BMI 35.0-39.9 without comorbidity)   6. Cardiac murmur    PLAN:    In order of problems listed above:  Primary prevention stressed with the patient.  Importance of compliance with diet and medication stressed and he vocalized understanding. Dyspnea on exertion: I would like to rule out coronary artery disease with an exercise stress Cardiolite and he is agreeable.  He has multiple risk factors for coronary artery disease. Essential hypertension: Blood pressure stable and diet was emphasized.  He will keep a track of blood pressures and get back to me. Mixed dyslipidemia and diabetes mellitus: Followed by primary care.  Diet was emphasized.  Lipids reviewed.   Obesity: Weight reduction stressed and diet emphasized.  Risks of obesity emphasized Cardiac murmur: Echocardiogram will be done to assess murmur heard on auscultation. Patient will be seen in follow-up appointment in 6 months or earlier if the patient has any concerns    Medication Adjustments/Labs and Tests Ordered: Current medicines are reviewed at length with the patient today.  Concerns regarding medicines are outlined above.  No orders of the defined types were placed in this encounter.  No orders of the defined types were placed in this encounter.    History of Present Illness:    Chase Villanueva is a 66 y.o. male who is being seen today for the evaluation of dyspnea on exertion at the request of Chase, Doy Mince, NP.  Patient is a pleasant 66 year old male.  He has past medical history of essential hypertension,  dyslipidemia, diabetes mellitus and obesity.  He is an ex-smoker.  He has had amputation of the toe of both extremities in the past.  He leads a sedentary lifestyle.  He gives history of dyspnea on exertion therefore he was referred here.  No chest pain orthopnea or PND.  At the time of my evaluation, the patient is alert awake oriented and in no distress.  Past Medical History:  Diagnosis Date   Chronic renal failure    COPD (chronic obstructive pulmonary disease) (HCC)    Dizziness and giddiness    Hiatal hernia    Hyperlipidemia    Hypertension, uncontrolled    Neurogenic bladder    Transient ischemic attack    Type 2 diabetes mellitus (HCC)     Past Surgical History:  Procedure Laterality Date   AMPUTATION TOE Right    3rd   CATARACT EXTRACTION Bilateral 2017   PENILE PROSTHESIS IMPLANT      Current Medications: Current Meds  Medication Sig   atorvastatin (LIPITOR) 80 MG tablet Take 80 mg by mouth daily.   clopidogrel (PLAVIX) 75 MG tablet Take 75 mg by mouth daily.   LEVEMIR 100 UNIT/ML injection SMARTSIG:100 Unit(s) SUB-Q Every Night   montelukast (SINGULAIR) 10 MG tablet Take 10 mg by mouth daily.   olmesartan (BENICAR) 40 MG tablet Take 40 mg by mouth daily.   OZEMPIC, 2 MG/DOSE, 8 MG/3ML SOPN Inject 1 Pen into the skin once a week.   tamsulosin (FLOMAX) 0.4 MG CAPS capsule Take 0.4 mg by  mouth daily.     Allergies:   Vancomycin   Social History   Socioeconomic History   Marital status: Married    Spouse name: Not on file   Number of children: Not on file   Years of education: Not on file   Highest education level: Not on file  Occupational History   Not on file  Tobacco Use   Smoking status: Former    Types: Cigarettes    Quit date: 84    Years since quitting: 28.6    Passive exposure: Past   Smokeless tobacco: Never  Vaping Use   Vaping Use: Never used  Substance and Sexual Activity   Alcohol use: Never   Drug use: Never   Sexual activity: Not on  file  Other Topics Concern   Not on file  Social History Narrative   Not on file   Social Determinants of Health   Financial Resource Strain: Not on file  Food Insecurity: Not on file  Transportation Needs: Not on file  Physical Activity: Not on file  Stress: Not on file  Social Connections: Not on file     Family History: The patient's family history includes Alzheimer's disease in his mother; Heart attack in his father, paternal grandfather, and paternal grandmother.  ROS:   Please see the history of present illness.    All other systems reviewed and are negative.  EKGs/Labs/Other Studies Reviewed:    The following studies were reviewed today: EKG reveals sinus rhythm and nonspecific ST-T changes   Recent Labs: No results found for requested labs within last 365 days.  Recent Lipid Panel No results found for: "CHOL", "TRIG", "HDL", "CHOLHDL", "VLDL", "LDLCALC", "LDLDIRECT"  Physical Exam:    VS:  BP (!) 140/74 (BP Location: Right Arm, Patient Position: Sitting, Cuff Size: Normal)   Pulse 86   Ht 5\' 10"  (1.778 m)   Wt 254 lb (115.2 kg)   SpO2 97%   BMI 36.45 kg/m     Wt Readings from Last 3 Encounters:  12/07/21 254 lb (115.2 kg)     GEN: Patient is in no acute distress HEENT: Normal NECK: No JVD; No carotid bruits LYMPHATICS: No lymphadenopathy CARDIAC: S1 S2 regular, 2/6 systolic murmur at the apex. RESPIRATORY:  Clear to auscultation without rales, wheezing or rhonchi  ABDOMEN: Soft, non-tender, non-distended MUSCULOSKELETAL:  No edema; No deformity  SKIN: Warm and dry NEUROLOGIC:  Alert and oriented x 3 PSYCHIATRIC:  Normal affect    Signed, 02/06/22, MD  12/07/2021 2:30 PM    [facility]

## 2021-12-07 NOTE — Addendum Note (Signed)
Addended by: Roosvelt Harps R on: 12/07/2021 04:24 PM   Modules accepted: Orders

## 2021-12-09 ENCOUNTER — Telehealth (HOSPITAL_COMMUNITY): Payer: Self-pay | Admitting: *Deleted

## 2021-12-09 NOTE — Telephone Encounter (Signed)
Patient given detailed instructions per Myocardial Perfusion Study Information Sheet for the test on 12/16/2021 at 11:15. Patient notified to arrive 15 minutes early and that it is imperative to arrive on time for appointment to keep from having the test rescheduled.  If you need to cancel or reschedule your appointment, please call the office within 24 hours of your appointment. . Patient verbalized understanding.Chase Villanueva

## 2021-12-16 ENCOUNTER — Ambulatory Visit (INDEPENDENT_AMBULATORY_CARE_PROVIDER_SITE_OTHER): Payer: Managed Care, Other (non HMO)

## 2021-12-16 DIAGNOSIS — E669 Obesity, unspecified: Secondary | ICD-10-CM

## 2021-12-16 DIAGNOSIS — I10 Essential (primary) hypertension: Secondary | ICD-10-CM | POA: Diagnosis not present

## 2021-12-16 DIAGNOSIS — E088 Diabetes mellitus due to underlying condition with unspecified complications: Secondary | ICD-10-CM

## 2021-12-16 DIAGNOSIS — R0609 Other forms of dyspnea: Secondary | ICD-10-CM | POA: Diagnosis not present

## 2021-12-16 DIAGNOSIS — R011 Cardiac murmur, unspecified: Secondary | ICD-10-CM

## 2021-12-16 DIAGNOSIS — N189 Chronic kidney disease, unspecified: Secondary | ICD-10-CM

## 2021-12-16 DIAGNOSIS — E782 Mixed hyperlipidemia: Secondary | ICD-10-CM | POA: Diagnosis not present

## 2021-12-16 LAB — MYOCARDIAL PERFUSION IMAGING
Estimated workload: 7.4
Exercise duration (min): 6 min
Exercise duration (sec): 29 s
LV dias vol: 104 mL (ref 62–150)
LV sys vol: 49 mL
MPHR: 156 {beats}/min
Nuc Stress EF: 53 %
Peak HR: 137 {beats}/min
Percent HR: 87 %
RPE: 19
Rest HR: 76 {beats}/min
Rest Nuclear Isotope Dose: 10.5 mCi
SDS: 0
SRS: 3
SSS: 3
Stress Nuclear Isotope Dose: 29.5 mCi
TID: 0.99

## 2021-12-16 LAB — ECHOCARDIOGRAM COMPLETE
Area-P 1/2: 3.6 cm2
Height: 70 in
S' Lateral: 3.3 cm
Weight: 4064 oz

## 2021-12-16 MED ORDER — TECHNETIUM TC 99M TETROFOSMIN IV KIT
29.5000 | PACK | Freq: Once | INTRAVENOUS | Status: AC | PRN
  Administered 2021-12-16: 29.5 via INTRAVENOUS

## 2021-12-16 MED ORDER — TECHNETIUM TC 99M TETROFOSMIN IV KIT
10.5000 | PACK | Freq: Once | INTRAVENOUS | Status: AC | PRN
  Administered 2021-12-16: 10.5 via INTRAVENOUS

## 2021-12-16 MED ORDER — PERFLUTREN LIPID MICROSPHERE
1.0000 mL | INTRAVENOUS | Status: AC | PRN
  Administered 2021-12-16: 4 mL via INTRAVENOUS

## 2021-12-17 ENCOUNTER — Other Ambulatory Visit: Payer: No Typology Code available for payment source

## 2021-12-18 ENCOUNTER — Telehealth: Payer: Self-pay

## 2021-12-18 NOTE — Telephone Encounter (Signed)
Results reviewed with pt as per Dr. Kem Parkinson note.  Pt verbalized understanding and had no additional questions. Pt will come Monday for labs and if normal will precede with CT scan.

## 2021-12-18 NOTE — Telephone Encounter (Signed)
-----   Message from Garwin Brothers, MD sent at 12/17/2021  8:41 AM EDT ----- In view of multiple risk factors I am a little concerned about this.  I would like to see if his kidney function is normal and if so would like to order a CT coronary angiography with FFR.  Copy primary care.  I am aware of his stress test report. Garwin Brothers, MD 12/17/2021 8:41 AM

## 2021-12-22 LAB — BASIC METABOLIC PANEL
BUN/Creatinine Ratio: 23 (ref 10–24)
BUN: 32 mg/dL — ABNORMAL HIGH (ref 8–27)
CO2: 22 mmol/L (ref 20–29)
Calcium: 9.2 mg/dL (ref 8.6–10.2)
Chloride: 102 mmol/L (ref 96–106)
Creatinine, Ser: 1.42 mg/dL — ABNORMAL HIGH (ref 0.76–1.27)
Glucose: 158 mg/dL — ABNORMAL HIGH (ref 70–99)
Potassium: 4.9 mmol/L (ref 3.5–5.2)
Sodium: 139 mmol/L (ref 134–144)
eGFR: 55 mL/min/{1.73_m2} — ABNORMAL LOW (ref >59)

## 2022-02-11 ENCOUNTER — Ambulatory Visit: Payer: Managed Care, Other (non HMO) | Admitting: Podiatry

## 2022-03-11 ENCOUNTER — Encounter: Payer: Self-pay | Admitting: Podiatry

## 2022-03-11 ENCOUNTER — Ambulatory Visit: Payer: Managed Care, Other (non HMO) | Admitting: Podiatry

## 2022-03-11 DIAGNOSIS — Z89421 Acquired absence of other right toe(s): Secondary | ICD-10-CM | POA: Diagnosis not present

## 2022-03-11 DIAGNOSIS — I739 Peripheral vascular disease, unspecified: Secondary | ICD-10-CM

## 2022-03-11 DIAGNOSIS — B351 Tinea unguium: Secondary | ICD-10-CM

## 2022-03-11 DIAGNOSIS — Z7189 Other specified counseling: Secondary | ICD-10-CM | POA: Insufficient documentation

## 2022-03-11 DIAGNOSIS — E1142 Type 2 diabetes mellitus with diabetic polyneuropathy: Secondary | ICD-10-CM

## 2022-03-11 DIAGNOSIS — L84 Corns and callosities: Secondary | ICD-10-CM | POA: Diagnosis not present

## 2022-03-11 DIAGNOSIS — M25519 Pain in unspecified shoulder: Secondary | ICD-10-CM | POA: Insufficient documentation

## 2022-03-11 DIAGNOSIS — E119 Type 2 diabetes mellitus without complications: Secondary | ICD-10-CM

## 2022-03-11 NOTE — Progress Notes (Signed)
ANNUAL DIABETIC FOOT EXAM  Subjective: Chase Villanueva presents today for annual diabetic foot examination.  Chief Complaint  Patient presents with   Nail Problem    DFC  BG - pt does not recall , he states its usually 120 A1C - 6.8  PCP - Dr Alinda Deem last OV October 24    Patient confirms h/o diabetes.  Patient relates 20 year h/o diabetes.  Patient denies any h/o foot wounds.  Patient has h/o foot 4th toe amputation right foot.  Patient has been diagnosed with neuropathy.  Risk factors:  amputation right 4th toe , diabetes, diabetic neuropathy, history of foot/leg ulcer, h/o TIA, HTN, CAD, COPD, CKD, hyperlipidemia, h/o tobacco use in remission.  Alinda Deem, MD is patient's PCP.  Past Medical History:  Diagnosis Date   Chronic renal failure    COPD (chronic obstructive pulmonary disease) (HCC)    Dizziness and giddiness    Hiatal hernia    Hyperlipidemia    Hypertension, uncontrolled    Neurogenic bladder    Transient ischemic attack    Type 2 diabetes mellitus [facility])    Patient Active Problem List   Diagnosis Date Noted   Other specified counseling 03/11/2022   Shoulder pain 03/11/2022   DOE (dyspnea on exertion) 12/07/2021   Essential hypertension 12/07/2021   Diabetes mellitus due to underlying condition with unspecified complications (HCC) 12/07/2021   Obesity (BMI 35.0-39.9 without comorbidity) 12/07/2021   Cardiac murmur 12/07/2021   Chronic renal failure 11/16/2021   COPD (chronic obstructive pulmonary disease) (HCC) 11/16/2021   Dizziness and giddiness 11/16/2021   Hiatal hernia 11/16/2021   Hyperlipidemia 11/16/2021   Hypertension, uncontrolled 11/16/2021   Neurogenic bladder 11/16/2021   Transient ischemic attack 11/16/2021   Type 2 diabetes mellitus (HCC) 11/16/2021   Sleep apnea 12/20/2018   Chronic obstructive pulmonary disease (HCC) 05/03/2014   CAD (coronary artery disease) 11/19/2013   Past Surgical History:  Procedure Laterality  Date   AMPUTATION TOE Right    3rd   CATARACT EXTRACTION Bilateral 2017   PENILE PROSTHESIS IMPLANT     Current Outpatient Medications on File Prior to Visit  Medication Sig Dispense Refill   aspirin 81 MG chewable tablet CHEW ONE TABLET BY MOUTH DAILY     RYBELSUS 14 MG TABS Take 1 tablet by mouth every morning.     clopidogrel (PLAVIX) 75 MG tablet Take 75 mg by mouth daily.     LEVEMIR 100 UNIT/ML injection SMARTSIG:100 Unit(s) SUB-Q Every Night     montelukast (SINGULAIR) 10 MG tablet Take 10 mg by mouth daily.     olmesartan (BENICAR) 40 MG tablet Take 40 mg by mouth daily.     OZEMPIC, 2 MG/DOSE, 8 MG/3ML SOPN Inject 1 Pen into the skin once a week.     No current facility-administered medications on file prior to visit.    Allergies  Allergen Reactions   Vancomycin Hives   Finasteride Other (See Comments)   Social History   Occupational History   Not on file  Tobacco Use   Smoking status: Former    Types: Cigarettes    Quit date: 1995    Years since quitting: 28.8    Passive exposure: Past   Smokeless tobacco: Never  Vaping Use   Vaping Use: Never used  Substance and Sexual Activity   Alcohol use: Never   Drug use: Never   Sexual activity: Not on file   Family History  Problem Relation Age of Onset   Alzheimer's  disease Mother    Heart attack Father    Heart attack Paternal Grandmother    Heart attack Paternal Grandfather     There is no immunization history on file for this patient.   Review of Systems: Negative except as noted in the HPI.   Objective: There were no vitals filed for this visit.  Zacarri Vaillant is a pleasant 66 y.o. male in NAD. AAO X 3.  Vascular Examination: CFT <4 seconds b/l LE. Faintly palpable DP pulses b/l LE. Faintly palpable PT pulse(s) b/l LE. Pedal hair absent. Lower extremity skin temperature gradient within normal limits. Trace edema noted BLE. No ischemia or gangrene noted b/l LE. No cyanosis or clubbing noted b/l  LE.  Dermatological Examination: Pedal skin is warm and supple b/l LE. No open wounds b/l LE. No interdigital macerations noted b/l LE. Toenails 1-5 left foot, R hallux, R 2nd toe, R 3rd toe, and R 5th toe elongated, discolored, dystrophic, thickened, and crumbly with subungual debris and tenderness to dorsal palpation. Hyperkeratotic lesion(s) submet head 1 right foot and submet head 4 right foot.  No erythema, no edema, no drainage, no fluctuance.  Neurological Examination: Protective sensation diminished with 10g monofilament b/l. Vibratory sensation intact b/l.  Musculoskeletal Examination: Muscle strength 5/5 to all lower extremity muscle groups bilaterally. Lower extremity amputation(s): digital amputation R 4th toe. Hammertoe(s) noted to the b/l lower extremities right >left.  Footwear Assessment: Does the patient wear appropriate shoes? Yes. Does the patient need inserts/orthotics? Yes.  ADA Risk Categorization: High Risk  Patient has one or more of the following: Loss of protective sensation Absent pedal pulses Severe Foot deformity History of foot ulcer  Assessment: 1. Onychomycosis   2. Callus   3. Status post amputation of lesser toe of right foot ([place])   4. PVD (peripheral vascular disease) ([place])   5. Diabetic polyneuropathy associated with type 2 diabetes mellitus ([place])   6. Encounter for diabetic foot exam [facility])     Plan: -Patient was evaluated and treated. All patient's and/or POA's questions/concerns answered on today's visit. -Diabetic foot examination performed today. -Continue foot and shoe inspections daily. Monitor blood glucose per PCP/Endocrinologist's recommendations. -Discussed diabetes shoe benefit available based on patient's diagnoses. Patient would like to proceed. Order entered for one pair extra depth shoes and 3 pair custom insoles for b/l lower extremities . Patient qualifies based on diagnoses. -Toenails 1-5 left foot, R hallux, R 2nd toe, R 3rd  toe, and R 5th toe debrided in length and girth without iatrogenic bleeding with sterile nail nipper and dremel.  -Callus(es) submet head 1 right foot and submet head 4 right foot pared utilizing sterile scalpel blade without complication or incident. Total number debrided =2. -Patient/POA to call should there be question/concern in the interim. Return in about 3 months (around 06/11/2022).  Marzetta Board, DPM

## 2022-05-05 ENCOUNTER — Ambulatory Visit: Payer: Self-pay | Admitting: *Deleted

## 2022-05-05 DIAGNOSIS — E1142 Type 2 diabetes mellitus with diabetic polyneuropathy: Secondary | ICD-10-CM

## 2022-05-05 DIAGNOSIS — Z89421 Acquired absence of other right toe(s): Secondary | ICD-10-CM

## 2022-05-05 DIAGNOSIS — I739 Peripheral vascular disease, unspecified: Secondary | ICD-10-CM

## 2022-05-05 NOTE — Progress Notes (Signed)
Patient presents to the office today for diabetic shoe and insole measuring.  Patient was measured with brannock device to determine size and width for 1 pair of extra depth shoes and foam casted for 3 pair of insoles.   ABN signed.   Documentation of medical necessity will be sent to patient's treating diabetic doctor to verify and sign.   Patient's diabetic provider: Greig Right, MD   Shoes and insoles will be ordered at that time and patient will be notified for an appointment for fitting when they arrive.   Brannock measurement: 10.5 M  Patient shoe selection-   1st   Shoe choice:   [id number] APEX  2nd  Shoe choice:   [id number] APEX  Shoe size ordered: 10.5 M

## 2022-05-31 ENCOUNTER — Telehealth: Payer: Self-pay | Admitting: Cardiology

## 2022-05-31 NOTE — Telephone Encounter (Signed)
Pt would like a callback regarding test results from 8/23. Please advise

## 2022-06-01 ENCOUNTER — Telehealth: Payer: Self-pay

## 2022-06-01 NOTE — Telephone Encounter (Signed)
Spoke with pt per Dr. Enos Fling reply. [facility] will see [facility] soon and will dicuss if a CT angio with the contrast is safe for him.

## 2022-06-01 NOTE — Telephone Encounter (Signed)
Pt called back regarding August tests and to see if Dr. Geraldo Pitter wanted to do any further testing. [facility] is [facility] and [facility] for kidney function. Please advise.

## 2022-06-07 ENCOUNTER — Encounter: Payer: Self-pay | Admitting: Podiatry

## 2022-06-17 ENCOUNTER — Encounter: Payer: Self-pay | Admitting: Podiatry

## 2022-06-17 ENCOUNTER — Ambulatory Visit: Payer: Medicare Other | Admitting: Podiatry

## 2022-06-17 VITALS — BP 146/84 | HR 78

## 2022-06-17 DIAGNOSIS — E1142 Type 2 diabetes mellitus with diabetic polyneuropathy: Secondary | ICD-10-CM

## 2022-06-17 DIAGNOSIS — B351 Tinea unguium: Secondary | ICD-10-CM

## 2022-06-17 DIAGNOSIS — Z89421 Acquired absence of other right toe(s): Secondary | ICD-10-CM | POA: Diagnosis not present

## 2022-06-17 DIAGNOSIS — L84 Corns and callosities: Secondary | ICD-10-CM

## 2022-06-17 DIAGNOSIS — I739 Peripheral vascular disease, unspecified: Secondary | ICD-10-CM | POA: Diagnosis not present

## 2022-06-17 NOTE — Progress Notes (Signed)
  Subjective:  Patient ID: Chase Villanueva, male    DOB: 10-14-1955,  MRN: [id number]  Chase Villanueva presents to clinic today for at risk foot care. Patient has h/o NIDDM, neuropathy and PAD with amputation of digital amputation R 4th toe  Chief Complaint  Patient presents with   Nail Problem    Diabetic Foot Care   PCP: Greig Right, MD Last Visit: Jan 2024 Latest A1C: 6.7 (Jan 2024)    New problem(s): None.   He is inquiring about status of his diabetic shoes.  PCP is Greig Right, MD.  Allergies  Allergen Reactions   Vancomycin Hives   Finasteride Other (See Comments)    Review of Systems: Negative except as noted in the HPI.  Objective:  Vitals:   06/17/22 1324  BP: (!) 146/84  Pulse: 78   Chase Villanueva is a pleasant 67 y.o. male morbidly obese in NAD. AAO x 3.  Vascular Examination: CFT <4 seconds b/l LE. Faintly palpable DP pulses b/l LE. Faintly palpable PT pulse(s) b/l LE. Pedal hair absent. Lower extremity skin temperature gradient within normal limits. Trace edema noted BLE. No ischemia or gangrene noted b/l LE. No cyanosis or clubbing noted b/l LE.  Dermatological Examination: Pedal skin is warm and supple b/l LE. No open wounds b/l LE. No interdigital macerations noted b/l LE.   Toenails 1-5 left foot, R hallux, R 2nd toe, R 3rd toe, and R 5th toe elongated, discolored, dystrophic, thickened, and crumbly with subungual debris and tenderness to dorsal palpation.   Hyperkeratotic lesion(s) distal tip of left 2nd toe, submet head 1 right foot and submet head 4 right foot.  No erythema, no edema, no drainage, no fluctuance.  Neurological Examination: Protective sensation diminished with 10g monofilament b/l. Vibratory sensation intact b/l.  Musculoskeletal Examination: Muscle strength 5/5 to all lower extremity muscle groups bilaterally.   Lower extremity amputation(s): digital amputation R 4th toe.   Hammertoe(s) noted to the b/l lower extremities  right >left.  Assessment/Plan: 1. Onychomycosis   2. Corns and callosities   3. Status post amputation of lesser toe of right foot ([place])   4. PVD (peripheral vascular disease) ([place])   5. Diabetic polyneuropathy associated with type 2 diabetes mellitus ([employer])     -Consent given for treatment as described below: -Examined patient. -Continue supportive shoe gear daily. -Patient updated on status of diabetic shoes. Shoes/inserts have not arrived. Orthotics/Prosthetics Department will notify him when shoes arrive to schedule an appointment for pickup. -Toenails were debrided in length and girth 1-5 left foot, right great toe, R 2nd toe, R 3rd toe, and R 5th toe with sterile nail nippers and dremel without iatrogenic bleeding.  -Corn(s) L 2nd toe and callus(es) submet head 1 right foot and submet head 4 right foot were pared utilizing sterile scalpel blade without incident. Total number debrided =3. -Patient/POA to call should there be question/concern in the interim.   Return in about 3 months (around 09/15/2022).  Marzetta Board, DPM

## 2022-06-18 ENCOUNTER — Encounter: Payer: Self-pay | Admitting: Podiatry

## 2022-06-30 ENCOUNTER — Ambulatory Visit (INDEPENDENT_AMBULATORY_CARE_PROVIDER_SITE_OTHER): Payer: Medicare Other | Admitting: Podiatry

## 2022-06-30 DIAGNOSIS — E1142 Type 2 diabetes mellitus with diabetic polyneuropathy: Secondary | ICD-10-CM

## 2022-06-30 NOTE — Progress Notes (Signed)
Patient presents today for diabetic shoes pick up. Patient voices no new complaints.   Shoes were fitted to patient's feet. No discomfort and no rubbing. Patient satisfied with the shoes and inserts   Shoes were dispensed to patient with instructions for break in wear and to call the office if any concerns or questions.

## 2022-08-15 IMAGING — CT CT PELVIS W/O CM
2 of 4 series · 16 of 46 positions shown, 18 images · non-contrast
Comparison: None.

CLINICAL DATA: Urinary retention.  Recent bladder catheterization.

EXAM:
CT PELVIS WITHOUT CONTRAST
TECHNIQUE: Multidetector CT imaging of the pelvis was performed following the
standard protocol without intravenous contrast.

[Series 5: thin soft · axial · 0.81mm/px · z∈[+840,+1104]mm · 13 of 573 slices shown, 15 images]
[im 23/573  soft-tissue]
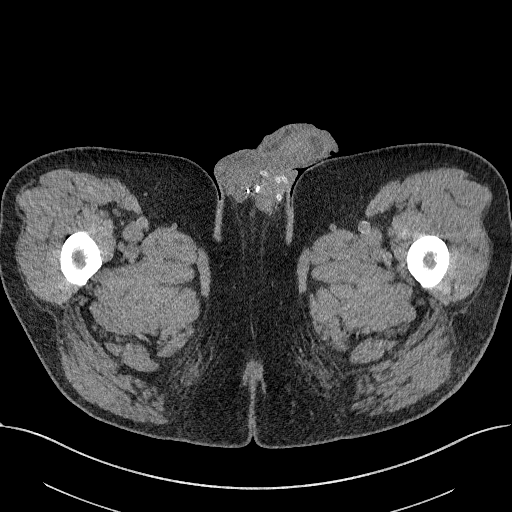
[im 23/573  bone]
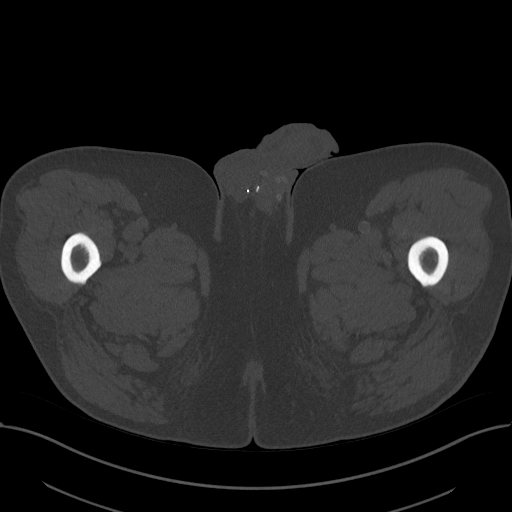
[im 69/573  soft-tissue]
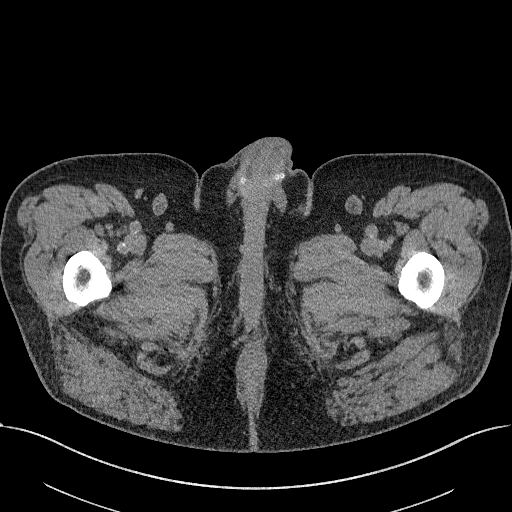
[im 115/573  soft-tissue]
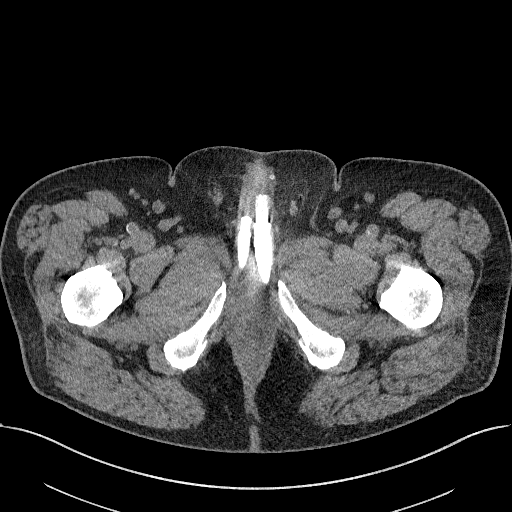
[im 161/573  soft-tissue]
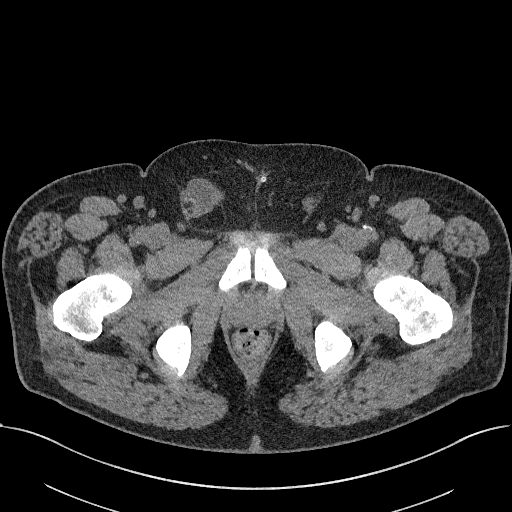
[im 206/573  soft-tissue]
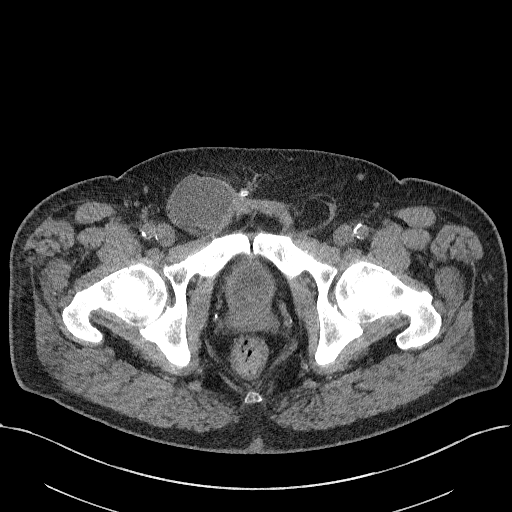
[im 252/573  soft-tissue]
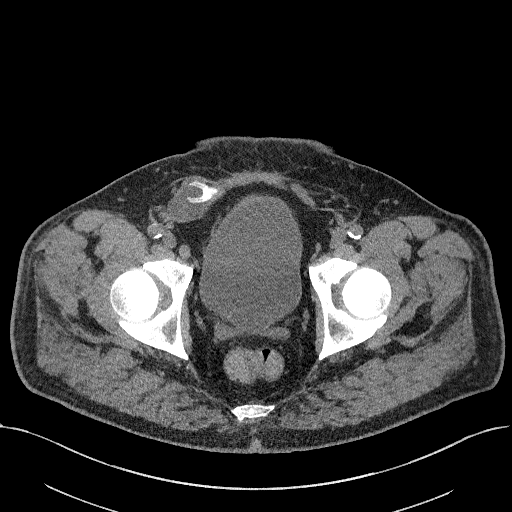
[im 298/573  soft-tissue]
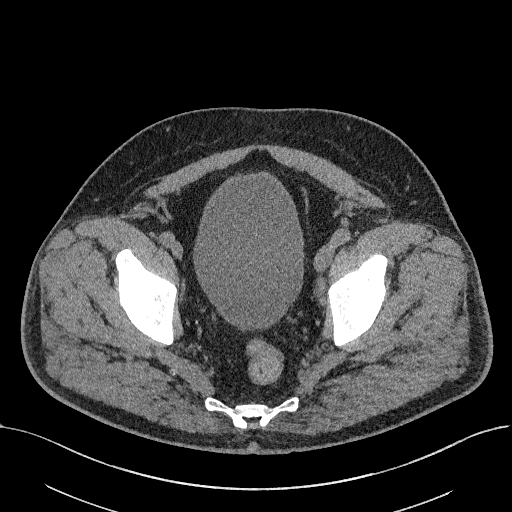
[im 321/573  soft-tissue]
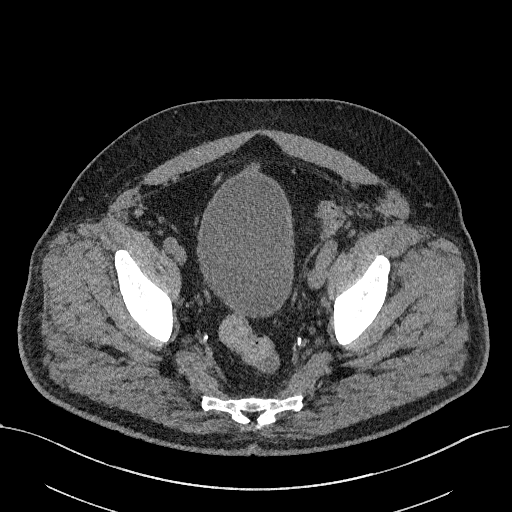
[im 367/573  soft-tissue]
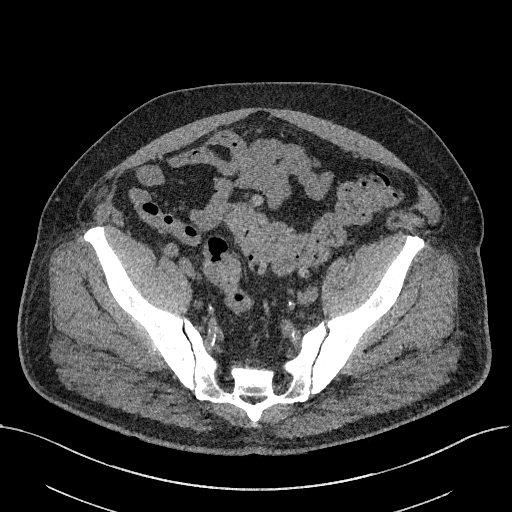
[im 367/573  bone]
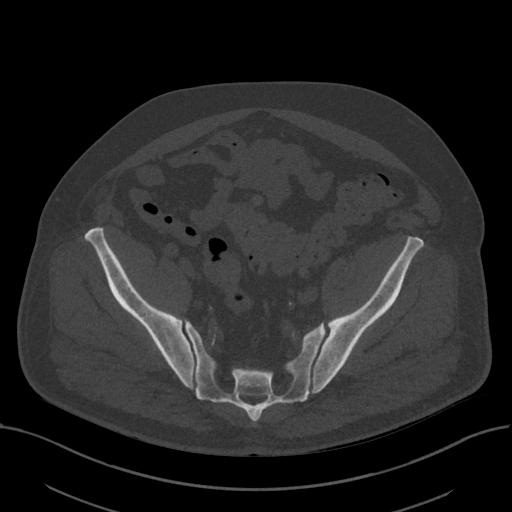
[im 412/573  soft-tissue]
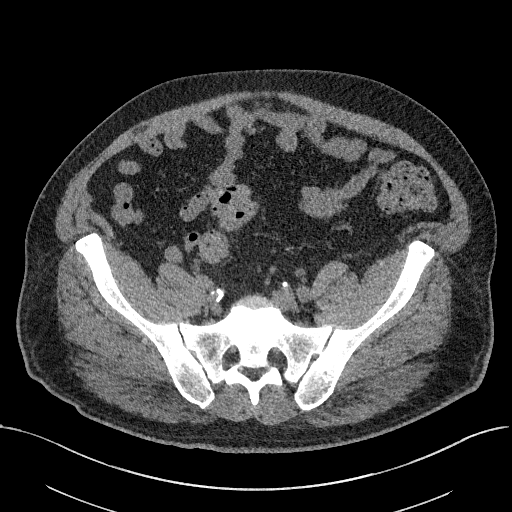
[im 458/573  soft-tissue]
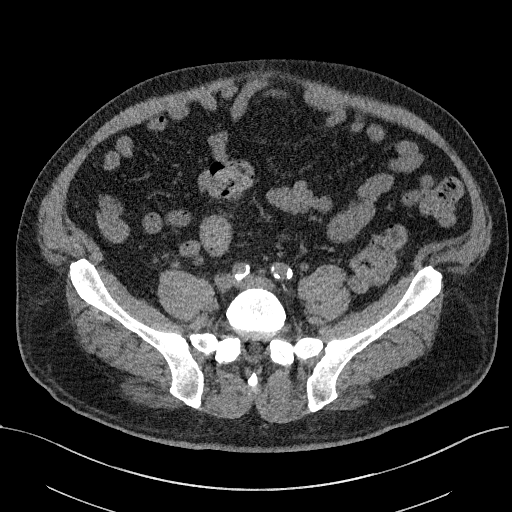
[im 504/573  soft-tissue]
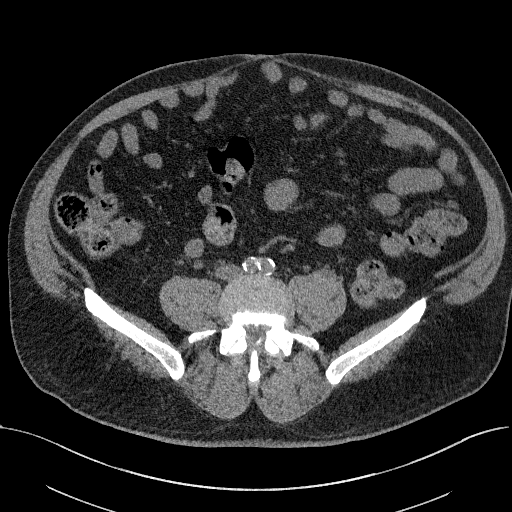
[im 550/573  soft-tissue]
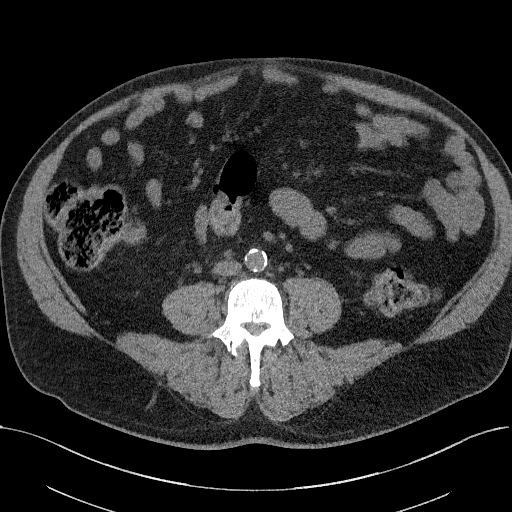

[Series 9: coronal soft · coronal · 0.58mm/px · 3 of 165 slices shown]
[im 55/165  soft-tissue]
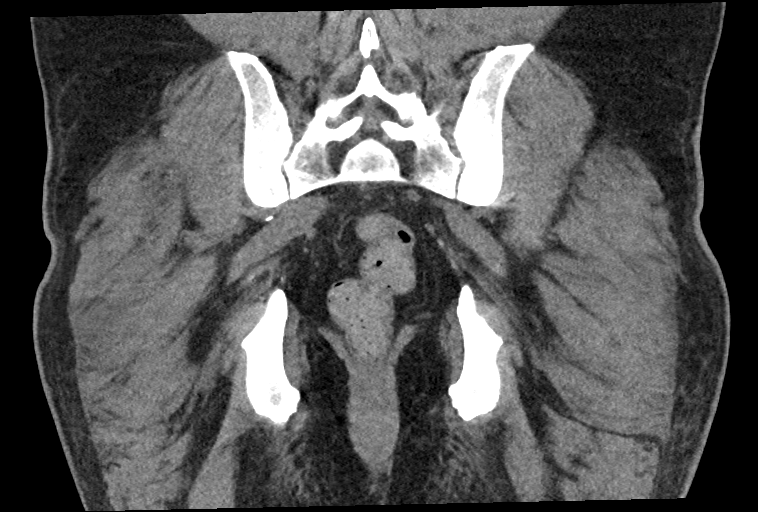
[im 73/165  soft-tissue]
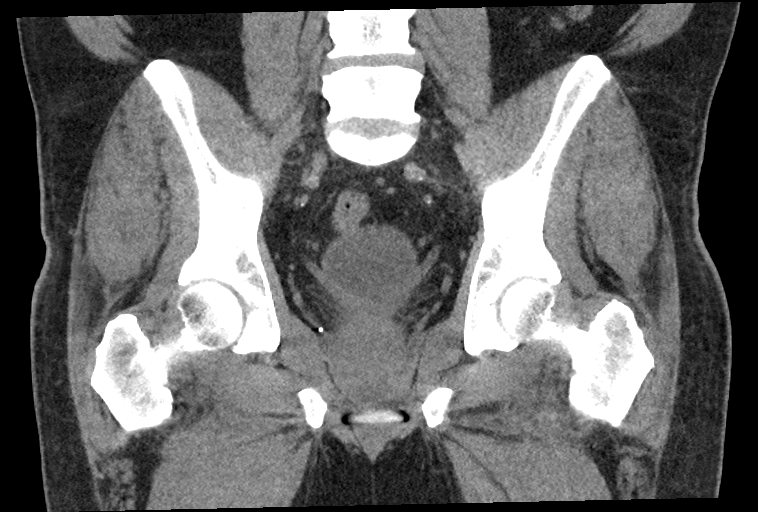
[im 92/165  soft-tissue]
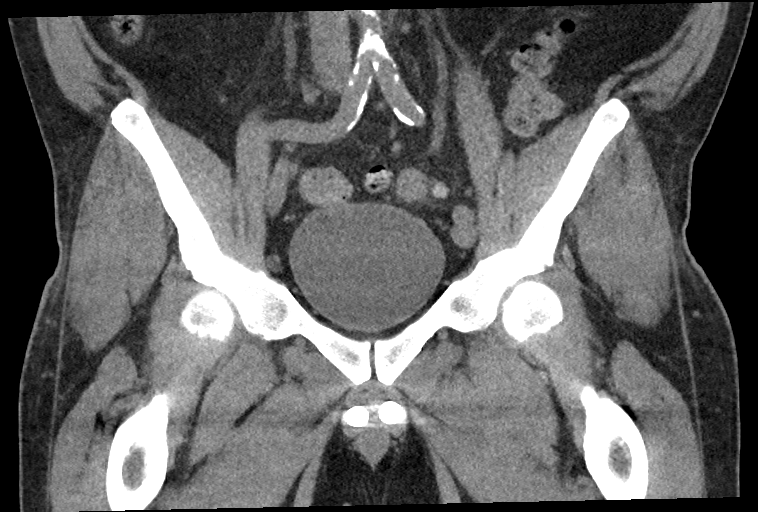

[16 of 46 positions shown; findings below may reference images not displayed]

FINDINGS: Urinary Tract: Bladder appears normal without distension. No
catheter visible presently. No bladder wall lesion is seen. Prostate
gland is not enlarged. There is a penile implant. No finding that
would predict compromise of the penile urethra.

Bowel: Mild diverticulosis of the sigmoid colon without evidence of
diverticulitis. No other bowel finding.

Vascular/Lymphatic: Aortic atherosclerosis. No aneurysm in the
region imaged. No adenopathy.

Reproductive: Penile implant as noted above. No complicating feature
is visible.

Other: Small bilateral inguinal hernias containing only fat. The
inflation balloon of the penile implant appears to be within the
inguinal canal on the right.

Musculoskeletal: No abnormality seen.
IMPRESSION: No abnormality seen to explain urinary retention. The bladder is
self appears normal. No evidence of prostate mass. There is a penile
implant in place which appears grossly unremarkable. I do not see
any feature that I would expect to obstruct the penile urethra.

## 2022-09-15 ENCOUNTER — Ambulatory Visit: Payer: Medicare Other | Admitting: Podiatry

## 2022-12-02 ENCOUNTER — Ambulatory Visit: Payer: Medicare Other | Admitting: Podiatry
